# Patient Record
Sex: Female | Born: 1950 | Race: Black or African American | Hispanic: No | Smoking: Current some day smoker
Health system: Southern US, Community
[De-identification: ages and names within clinical notes are randomized; demographics above are authoritative.]

## PROBLEM LIST (undated history)

## (undated) DIAGNOSIS — D649 Anemia, unspecified: Secondary | ICD-10-CM

## (undated) DIAGNOSIS — M797 Fibromyalgia: Secondary | ICD-10-CM

## (undated) DIAGNOSIS — G8929 Other chronic pain: Secondary | ICD-10-CM

## (undated) DIAGNOSIS — E119 Type 2 diabetes mellitus without complications: Secondary | ICD-10-CM

## (undated) DIAGNOSIS — J45909 Unspecified asthma, uncomplicated: Secondary | ICD-10-CM

## (undated) DIAGNOSIS — M1990 Unspecified osteoarthritis, unspecified site: Secondary | ICD-10-CM

## (undated) DIAGNOSIS — K219 Gastro-esophageal reflux disease without esophagitis: Secondary | ICD-10-CM

## (undated) DIAGNOSIS — R079 Chest pain, unspecified: Secondary | ICD-10-CM

## (undated) DIAGNOSIS — M545 Low back pain, unspecified: Secondary | ICD-10-CM

## (undated) DIAGNOSIS — G4733 Obstructive sleep apnea (adult) (pediatric): Secondary | ICD-10-CM

## (undated) DIAGNOSIS — I10 Essential (primary) hypertension: Secondary | ICD-10-CM

## (undated) DIAGNOSIS — R0602 Shortness of breath: Secondary | ICD-10-CM

## (undated) DIAGNOSIS — G5603 Carpal tunnel syndrome, bilateral upper limbs: Secondary | ICD-10-CM

## (undated) DIAGNOSIS — G43909 Migraine, unspecified, not intractable, without status migrainosus: Secondary | ICD-10-CM

## (undated) HISTORY — PX: FOOT SURGERY: SHX648

## (undated) HISTORY — PX: CARPAL TUNNEL RELEASE: SHX101

## (undated) HISTORY — PX: TONSILLECTOMY: SUR1361

---

## 1974-09-20 HISTORY — PX: HEMORRHOID SURGERY: SHX153

## 1979-05-22 HISTORY — PX: BREAST SURGERY: SHX581

## 2007-09-21 HISTORY — PX: BREAST SURGERY: SHX581

## 2009-05-01 ENCOUNTER — Encounter: Admission: RE | Admit: 2009-05-01 | Discharge: 2009-05-01 | Payer: Self-pay | Admitting: Internal Medicine

## 2009-05-09 ENCOUNTER — Ambulatory Visit: Payer: Self-pay | Admitting: Vascular Surgery

## 2009-08-11 ENCOUNTER — Encounter: Admission: RE | Admit: 2009-08-11 | Discharge: 2009-08-11 | Payer: Self-pay | Admitting: Family Medicine

## 2010-10-11 ENCOUNTER — Encounter: Payer: Self-pay | Admitting: Family Medicine

## 2010-10-12 ENCOUNTER — Encounter: Payer: Self-pay | Admitting: Family Medicine

## 2010-10-21 ENCOUNTER — Encounter: Payer: Self-pay | Admitting: Internal Medicine

## 2010-11-03 ENCOUNTER — Other Ambulatory Visit: Payer: Self-pay | Admitting: Internal Medicine

## 2010-11-03 DIAGNOSIS — M4642 Discitis, unspecified, cervical region: Secondary | ICD-10-CM

## 2010-11-08 ENCOUNTER — Other Ambulatory Visit: Payer: Self-pay

## 2010-11-13 ENCOUNTER — Ambulatory Visit
Admission: RE | Admit: 2010-11-13 | Discharge: 2010-11-13 | Disposition: A | Discharge status: Home / Self Care | Payer: Medicare PPO | Source: Ambulatory Visit | Attending: Internal Medicine | Admitting: Internal Medicine

## 2010-11-13 DIAGNOSIS — M4642 Discitis, unspecified, cervical region: Secondary | ICD-10-CM

## 2010-11-30 ENCOUNTER — Other Ambulatory Visit: Payer: Self-pay | Admitting: Geriatric Medicine

## 2010-11-30 DIAGNOSIS — N950 Postmenopausal bleeding: Secondary | ICD-10-CM

## 2010-12-03 ENCOUNTER — Other Ambulatory Visit: Payer: Medicare PPO

## 2010-12-07 ENCOUNTER — Other Ambulatory Visit: Payer: Medicare PPO

## 2010-12-07 ENCOUNTER — Ambulatory Visit
Admission: RE | Admit: 2010-12-07 | Discharge: 2010-12-07 | Disposition: A | Discharge status: Home / Self Care | Payer: Medicare PPO | Source: Ambulatory Visit | Attending: Geriatric Medicine | Admitting: Geriatric Medicine

## 2010-12-07 ENCOUNTER — Inpatient Hospital Stay: Admission: RE | Admit: 2010-12-07 | Payer: Medicare PPO | Source: Ambulatory Visit

## 2010-12-07 DIAGNOSIS — N950 Postmenopausal bleeding: Secondary | ICD-10-CM

## 2011-02-02 NOTE — Procedures (Signed)
DUPLEX DEEP VENOUS EXAM - LOWER EXTREMITY   INDICATION:  Left ankle/foot edema.   HISTORY:  Edema:  Left lower extremity ankle/foot since May.  Trauma/Surgery:  Pain:  Left lower extremity since May.  PE:  No.  Previous DVT:  No.  Anticoagulants:  No.  Other:   DUPLEX EXAM:                CFV   SFV   PopV   PTV   GSV                R  L  R  L  R  L   R  L  R  L  Thrombosis    0  0     0     0      0     0  Spontaneous   +  +     +     +      +     +  Phasic        +  +     +     +      +     +  Augmentation  +  +     +     +      +     +  Compressible  +  +     +     +      +     +  Competent     +  D     +     +      +     +   Legend:  + - yes  o - no  p - partial  D - decreased   IMPRESSION:  No evidence of deep or superficial venous thrombosis in the  left lower extremity or right common femoral vein.    _____________________________  Quita Skye Hart [place], M.D.   AS/MEDQ  D:  05/09/2009  T:  05/10/2009  Job:  [id number]

## 2013-03-28 ENCOUNTER — Other Ambulatory Visit (HOSPITAL_COMMUNITY): Payer: Self-pay | Admitting: Internal Medicine

## 2013-03-28 DIAGNOSIS — M25562 Pain in left knee: Secondary | ICD-10-CM

## 2013-03-28 DIAGNOSIS — M7989 Other specified soft tissue disorders: Secondary | ICD-10-CM

## 2013-03-29 ENCOUNTER — Ambulatory Visit (HOSPITAL_COMMUNITY)
Admission: RE | Admit: 2013-03-29 | Discharge: 2013-03-29 | Disposition: A | Discharge status: Home / Self Care | Payer: Medicare PPO | Source: Ambulatory Visit | Attending: Internal Medicine | Admitting: Internal Medicine

## 2013-03-29 DIAGNOSIS — M7989 Other specified soft tissue disorders: Secondary | ICD-10-CM

## 2013-03-29 DIAGNOSIS — M25562 Pain in left knee: Secondary | ICD-10-CM

## 2013-03-29 NOTE — Progress Notes (Signed)
VASCULAR LAB PRELIMINARY  PRELIMINARY  PRELIMINARY  PRELIMINARY  Left lower extremity venous duplex completed.    Preliminary report:  Left:  No evidence of DVT, superficial thrombosis, or Baker's cyst.  Cris Gibby, RVT 03/29/2013, 11:52 AM

## 2013-04-18 ENCOUNTER — Encounter (HOSPITAL_COMMUNITY): Payer: Self-pay | Admitting: Emergency Medicine

## 2013-04-18 ENCOUNTER — Emergency Department (HOSPITAL_COMMUNITY): Payer: Medicare PPO

## 2013-04-18 ENCOUNTER — Inpatient Hospital Stay (HOSPITAL_COMMUNITY)
Admission: EM | Admit: 2013-04-18 | Discharge: 2013-04-20 | DRG: 916 | Disposition: A | Discharge status: Home / Self Care | Payer: Medicare PPO | Attending: Internal Medicine | Admitting: Internal Medicine

## 2013-04-18 DIAGNOSIS — I5032 Chronic diastolic (congestive) heart failure: Secondary | ICD-10-CM | POA: Diagnosis present

## 2013-04-18 DIAGNOSIS — E119 Type 2 diabetes mellitus without complications: Secondary | ICD-10-CM

## 2013-04-18 DIAGNOSIS — I5030 Unspecified diastolic (congestive) heart failure: Secondary | ICD-10-CM | POA: Diagnosis present

## 2013-04-18 DIAGNOSIS — E871 Hypo-osmolality and hyponatremia: Secondary | ICD-10-CM

## 2013-04-18 DIAGNOSIS — R079 Chest pain, unspecified: Secondary | ICD-10-CM

## 2013-04-18 DIAGNOSIS — I10 Essential (primary) hypertension: Secondary | ICD-10-CM | POA: Diagnosis present

## 2013-04-18 DIAGNOSIS — G8929 Other chronic pain: Secondary | ICD-10-CM | POA: Diagnosis present

## 2013-04-18 DIAGNOSIS — K3189 Other diseases of stomach and duodenum: Secondary | ICD-10-CM | POA: Diagnosis present

## 2013-04-18 DIAGNOSIS — F39 Unspecified mood [affective] disorder: Secondary | ICD-10-CM | POA: Diagnosis present

## 2013-04-18 DIAGNOSIS — D509 Iron deficiency anemia, unspecified: Secondary | ICD-10-CM

## 2013-04-18 DIAGNOSIS — G4730 Sleep apnea, unspecified: Secondary | ICD-10-CM | POA: Diagnosis present

## 2013-04-18 DIAGNOSIS — R109 Unspecified abdominal pain: Secondary | ICD-10-CM

## 2013-04-18 DIAGNOSIS — G629 Polyneuropathy, unspecified: Secondary | ICD-10-CM | POA: Diagnosis present

## 2013-04-18 DIAGNOSIS — M129 Arthropathy, unspecified: Secondary | ICD-10-CM | POA: Diagnosis present

## 2013-04-18 DIAGNOSIS — E876 Hypokalemia: Secondary | ICD-10-CM

## 2013-04-18 DIAGNOSIS — G589 Mononeuropathy, unspecified: Secondary | ICD-10-CM | POA: Diagnosis present

## 2013-04-18 DIAGNOSIS — IMO0001 Reserved for inherently not codable concepts without codable children: Secondary | ICD-10-CM | POA: Diagnosis present

## 2013-04-18 DIAGNOSIS — K5900 Constipation, unspecified: Secondary | ICD-10-CM

## 2013-04-18 DIAGNOSIS — T783XXA Angioneurotic edema, initial encounter: Principal | ICD-10-CM

## 2013-04-18 DIAGNOSIS — J45909 Unspecified asthma, uncomplicated: Secondary | ICD-10-CM | POA: Diagnosis present

## 2013-04-18 DIAGNOSIS — T46905A Adverse effect of unspecified agents primarily affecting the cardiovascular system, initial encounter: Secondary | ICD-10-CM | POA: Diagnosis present

## 2013-04-18 DIAGNOSIS — Z794 Long term (current) use of insulin: Secondary | ICD-10-CM

## 2013-04-18 DIAGNOSIS — T783XXD Angioneurotic edema, subsequent encounter: Secondary | ICD-10-CM

## 2013-04-18 HISTORY — DX: Unspecified osteoarthritis, unspecified site: M19.90

## 2013-04-18 HISTORY — DX: Low back pain, unspecified: M54.50

## 2013-04-18 HISTORY — DX: Low back pain: M54.5

## 2013-04-18 HISTORY — DX: Obstructive sleep apnea (adult) (pediatric): G47.33

## 2013-04-18 HISTORY — DX: Unspecified asthma, uncomplicated: J45.909

## 2013-04-18 HISTORY — DX: Chest pain, unspecified: R07.9

## 2013-04-18 HISTORY — DX: Essential (primary) hypertension: I10

## 2013-04-18 HISTORY — DX: Carpal tunnel syndrome, bilateral upper limbs: G56.03

## 2013-04-18 HISTORY — DX: Migraine, unspecified, not intractable, without status migrainosus: G43.909

## 2013-04-18 HISTORY — DX: Shortness of breath: R06.02

## 2013-04-18 HISTORY — DX: Other chronic pain: G89.29

## 2013-04-18 HISTORY — DX: Anemia, unspecified: D64.9

## 2013-04-18 HISTORY — DX: Gastro-esophageal reflux disease without esophagitis: K21.9

## 2013-04-18 HISTORY — DX: Fibromyalgia: M79.7

## 2013-04-18 HISTORY — DX: Type 2 diabetes mellitus without complications: E11.9

## 2013-04-18 LAB — COMPREHENSIVE METABOLIC PANEL
ALT: 11 U/L (ref 0–35)
AST: 25 U/L (ref 0–37)
Albumin: 3.9 g/dL (ref 3.5–5.2)
Alkaline Phosphatase: 109 U/L (ref 39–117)
BUN: 9 mg/dL (ref 6–23)
CO2: 23 mEq/L (ref 19–32)
Calcium: 9.3 mg/dL (ref 8.4–10.5)
Chloride: 84 mEq/L — ABNORMAL LOW (ref 96–112)
Creatinine, Ser: 0.97 mg/dL (ref 0.50–1.10)
GFR calc Af Amer: 72 mL/min — ABNORMAL LOW (ref >90)
GFR calc non Af Amer: 62 mL/min — ABNORMAL LOW (ref >90)
Glucose, Bld: 124 mg/dL — ABNORMAL HIGH (ref 70–99)
Potassium: 4.9 mEq/L (ref 3.5–5.1)
Sodium: 121 mEq/L — ABNORMAL LOW (ref 135–145)
Total Bilirubin: 0.3 mg/dL (ref 0.3–1.2)
Total Protein: 7.3 g/dL (ref 6.0–8.3)

## 2013-04-18 LAB — CBC WITH DIFFERENTIAL/PLATELET
Basophils Absolute: 0.1 10*3/uL (ref 0.0–0.1)
Basophils Relative: 1 % (ref 0–1)
Eosinophils Absolute: 0.2 10*3/uL (ref 0.0–0.7)
Eosinophils Relative: 3 % (ref 0–5)
HCT: 27.5 % — ABNORMAL LOW (ref 36.0–46.0)
Hemoglobin: 8.9 g/dL — ABNORMAL LOW (ref 12.0–15.0)
Lymphocytes Relative: 21 % (ref 12–46)
Lymphs Abs: 1.9 10*3/uL (ref 0.7–4.0)
MCH: 23.6 pg — ABNORMAL LOW (ref 26.0–34.0)
MCHC: 32.4 g/dL (ref 30.0–36.0)
MCV: 72.9 fL — ABNORMAL LOW (ref 78.0–100.0)
Monocytes Absolute: 1 10*3/uL (ref 0.1–1.0)
Monocytes Relative: 11 % (ref 3–12)
Neutro Abs: 5.8 10*3/uL (ref 1.7–7.7)
Neutrophils Relative %: 65 % (ref 43–77)
Platelets: 376 10*3/uL (ref 150–400)
RBC: 3.77 MIL/uL — ABNORMAL LOW (ref 3.87–5.11)
RDW: 18.1 % — ABNORMAL HIGH (ref 11.5–15.5)
WBC: 8.9 10*3/uL (ref 4.0–10.5)

## 2013-04-18 LAB — BASIC METABOLIC PANEL
CO2: 21 mEq/L (ref 19–32)
GFR calc non Af Amer: 67 mL/min — ABNORMAL LOW (ref >90)
Glucose, Bld: 235 mg/dL — ABNORMAL HIGH (ref 70–99)
Potassium: 4.8 mEq/L (ref 3.5–5.1)
Sodium: 119 mEq/L — CL (ref 135–145)

## 2013-04-18 LAB — CBC
MCH: 24 pg — ABNORMAL LOW (ref 26.0–34.0)
Platelets: 379 10*3/uL (ref 150–400)
RBC: 3.84 MIL/uL — ABNORMAL LOW (ref 3.87–5.11)
WBC: 9.6 10*3/uL (ref 4.0–10.5)

## 2013-04-18 LAB — URINALYSIS, ROUTINE W REFLEX MICROSCOPIC
Bilirubin Urine: NEGATIVE
Glucose, UA: NEGATIVE mg/dL
Hgb urine dipstick: NEGATIVE
Ketones, ur: NEGATIVE mg/dL
Leukocytes, UA: NEGATIVE
Nitrite: NEGATIVE
Protein, ur: NEGATIVE mg/dL
Specific Gravity, Urine: 1.011 (ref 1.005–1.030)
Urobilinogen, UA: 0.2 mg/dL (ref 0.0–1.0)
pH: 5.5 (ref 5.0–8.0)

## 2013-04-18 LAB — GLUCOSE, CAPILLARY: Glucose-Capillary: 282 mg/dL — ABNORMAL HIGH (ref 70–99)

## 2013-04-18 LAB — CREATININE, SERUM
Creatinine, Ser: 0.94 mg/dL (ref 0.50–1.10)
GFR calc Af Amer: 75 mL/min — ABNORMAL LOW (ref >90)

## 2013-04-18 LAB — TROPONIN I: Troponin I: <0.3 ng/mL (ref <0.30)

## 2013-04-18 LAB — D-DIMER, QUANTITATIVE (NOT AT ARMC): D-Dimer, Quant: 7.35 ug/mL-FEU — ABNORMAL HIGH (ref 0.00–0.48)

## 2013-04-18 LAB — URIC ACID: Uric Acid, Serum: 6.8 mg/dL (ref 2.4–7.0)

## 2013-04-18 MED ORDER — IOHEXOL 300 MG/ML  SOLN
100.0000 mL | Freq: Once | INTRAMUSCULAR | Status: AC | PRN
  Administered 2013-04-18: 100 mL via INTRAVENOUS

## 2013-04-18 MED ORDER — FAMOTIDINE IN NACL 20-0.9 MG/50ML-% IV SOLN
20.0000 mg | Freq: Once | INTRAVENOUS | Status: AC
  Administered 2013-04-18: 20 mg via INTRAVENOUS
  Filled 2013-04-18: qty 50

## 2013-04-18 MED ORDER — ACETAMINOPHEN 325 MG PO TABS: 650.0000 mg | ORAL_TABLET | Freq: Four times a day (QID) | ORAL | Status: DC | PRN

## 2013-04-18 MED ORDER — BUTALBITAL-APAP-CAFFEINE 50-325-40 MG PO TABS: 1.0000 | ORAL_TABLET | ORAL | Status: DC | PRN

## 2013-04-18 MED ORDER — OXYCODONE HCL 10 MG PO TB12
10.0000 mg | ORAL_TABLET | Freq: Four times a day (QID) | ORAL | Status: DC | PRN
  Filled 2013-04-18: qty 1

## 2013-04-18 MED ORDER — METHYLPREDNISOLONE SODIUM SUCC 125 MG IJ SOLR
125.0000 mg | INTRAMUSCULAR | Status: AC
  Administered 2013-04-18: 125 mg via INTRAVENOUS
  Filled 2013-04-18: qty 2

## 2013-04-18 MED ORDER — FUROSEMIDE 40 MG PO TABS
40.0000 mg | ORAL_TABLET | Freq: Two times a day (BID) | ORAL | Status: DC
Start: 2013-04-19 — End: 2013-04-18
  Filled 2013-04-18: qty 1

## 2013-04-18 MED ORDER — BISACODYL 10 MG RE SUPP: 10.0000 mg | Freq: Every day | RECTAL | Status: DC | PRN

## 2013-04-18 MED ORDER — ENOXAPARIN SODIUM 40 MG/0.4ML ~~LOC~~ SOLN
40.0000 mg | SUBCUTANEOUS | Status: DC
  Administered 2013-04-18 – 2013-04-19 (×2): 40 mg via SUBCUTANEOUS
  Filled 2013-04-18 (×3): qty 0.4

## 2013-04-18 MED ORDER — MORPHINE SULFATE 2 MG/ML IJ SOLN: 1.0000 mg | INTRAMUSCULAR | Status: DC | PRN

## 2013-04-18 MED ORDER — SODIUM CHLORIDE 0.9 % IV SOLN
INTRAVENOUS | Status: DC
  Administered 2013-04-18: via INTRAVENOUS

## 2013-04-18 MED ORDER — ESTRADIOL 2 MG PO TABS
2.0000 mg | ORAL_TABLET | Freq: Every day | ORAL | Status: DC
  Administered 2013-04-18: 2 mg via ORAL
  Filled 2013-04-18: qty 1

## 2013-04-18 MED ORDER — DIPHENHYDRAMINE HCL 50 MG/ML IJ SOLN
25.0000 mg | Freq: Once | INTRAMUSCULAR | Status: AC
  Administered 2013-04-18: 25 mg via INTRAVENOUS
  Filled 2013-04-18: qty 1

## 2013-04-18 MED ORDER — ACETAMINOPHEN 650 MG RE SUPP: 650.0000 mg | Freq: Four times a day (QID) | RECTAL | Status: DC | PRN

## 2013-04-18 MED ORDER — IOHEXOL 300 MG/ML  SOLN
25.0000 mL | INTRAMUSCULAR | Status: AC
  Administered 2013-04-18: 25 mL via ORAL

## 2013-04-18 MED ORDER — METHYLPREDNISOLONE SODIUM SUCC 40 MG IJ SOLR
40.0000 mg | Freq: Two times a day (BID) | INTRAMUSCULAR | Status: DC
  Administered 2013-04-18 – 2013-04-19 (×2): 40 mg via INTRAVENOUS
  Filled 2013-04-18 (×3): qty 1

## 2013-04-18 MED ORDER — INSULIN ASPART 100 UNIT/ML ~~LOC~~ SOLN
0.0000 [IU] | Freq: Three times a day (TID) | SUBCUTANEOUS | Status: DC
  Administered 2013-04-19: 13:00:00 5 [IU] via SUBCUTANEOUS
  Administered 2013-04-19 (×2): 7 [IU] via SUBCUTANEOUS
  Administered 2013-04-20: 2 [IU] via SUBCUTANEOUS

## 2013-04-18 MED ORDER — MAGNESIUM HYDROXIDE 400 MG/5ML PO SUSP: 30.0000 mL | Freq: Every day | ORAL | Status: DC | PRN

## 2013-04-18 MED ORDER — MORPHINE SULFATE 4 MG/ML IJ SOLN
4.0000 mg | Freq: Once | INTRAMUSCULAR | Status: AC
  Administered 2013-04-18: 4 mg via INTRAVENOUS
  Filled 2013-04-18: qty 1

## 2013-04-18 MED ORDER — CARISOPRODOL 350 MG PO TABS
350.0000 mg | ORAL_TABLET | Freq: Two times a day (BID) | ORAL | Status: DC
Start: 2013-04-18 — End: 2013-04-18
  Filled 2013-04-18: qty 1

## 2013-04-18 MED ORDER — DIPHENHYDRAMINE HCL 50 MG/ML IJ SOLN
25.0000 mg | Freq: Four times a day (QID) | INTRAMUSCULAR | Status: DC | PRN
Start: 2013-04-18 — End: 2013-04-20
  Administered 2013-04-19 (×3): 25 mg via INTRAVENOUS
  Filled 2013-04-18 (×3): qty 1

## 2013-04-18 MED ORDER — INSULIN ASPART 100 UNIT/ML ~~LOC~~ SOLN
0.0000 [IU] | Freq: Every day | SUBCUTANEOUS | Status: DC
  Administered 2013-04-18: 3 [IU] via SUBCUTANEOUS
  Administered 2013-04-19: 2 [IU] via SUBCUTANEOUS

## 2013-04-18 MED ORDER — INSULIN GLARGINE 100 UNIT/ML ~~LOC~~ SOLN
15.0000 [IU] | Freq: Every day | SUBCUTANEOUS | Status: DC
  Administered 2013-04-18 – 2013-04-19 (×2): 15 [IU] via SUBCUTANEOUS
  Filled 2013-04-18 (×3): qty 0.15

## 2013-04-18 MED ORDER — FAMOTIDINE IN NACL 20-0.9 MG/50ML-% IV SOLN
20.0000 mg | Freq: Two times a day (BID) | INTRAVENOUS | Status: DC
  Administered 2013-04-19 – 2013-04-20 (×3): 20 mg via INTRAVENOUS
  Filled 2013-04-18 (×5): qty 50

## 2013-04-18 MED ORDER — FENTANYL CITRATE 0.05 MG/ML IJ SOLN
100.0000 ug | Freq: Once | INTRAMUSCULAR | Status: AC
  Administered 2013-04-18: 100 ug via INTRAVENOUS
  Filled 2013-04-18: qty 2

## 2013-04-18 MED ORDER — SODIUM CHLORIDE 0.9 % IJ SOLN
3.0000 mL | Freq: Two times a day (BID) | INTRAMUSCULAR | Status: DC
  Administered 2013-04-19 – 2013-04-20 (×3): 3 mL via INTRAVENOUS

## 2013-04-18 MED ORDER — HYDROCODONE-ACETAMINOPHEN 5-325 MG PO TABS
1.0000 | ORAL_TABLET | ORAL | Status: DC | PRN
  Administered 2013-04-18 – 2013-04-20 (×5): 2 via ORAL
  Filled 2013-04-18 (×6): qty 2

## 2013-04-18 NOTE — ED Notes (Signed)
Pt states CP started last night and woke her from sleep.  She thought is was gas pain. This morning around 1030 when she woke up she noticed facial swelling on the R side of her face, down neck and upper arm.  States the pain is across the top of her chest, down her sternum, under each breast, and in her back.

## 2013-04-18 NOTE — Progress Notes (Signed)
Was called about patient who is a 61 year old female history of hypertension, hyperlipidemia, diabetes ON presented with midsternal chest pain and upper abdominal pain chest it has been pressure in nature and not care for several days. Patient also noted to have right facial swelling right upper lip swelling however per ED PA, patient is speaking in full sentences he is satting greater than 90% and is in no acute arrest artery distress. Patient also noted to have a sodium of 121. Patient also noted to be on ACE inhibitor. Patient has been accepted to a step down bed. Patient to be seen by the hospitalist in the emergency room prior to transfer to a step down bed. Patient will need to be admitted for chest pain rule out. D-dimer is pending as patient is on estradiol as well. First set of troponin i-STAT negative. EKG with no acute abnormalities the ED PA.

## 2013-04-18 NOTE — ED Provider Notes (Signed)
Medical screening examination/treatment/procedure(s) were performed by non-physician practitioner and as supervising physician I was immediately available for consultation/collaboration.  Flint Melter, MD 04/18/13 [phone]

## 2013-04-18 NOTE — ED Provider Notes (Signed)
CSN: [id number]     Arrival date & time 04/18/13  1249 History     First MD Initiated Contact with Patient 04/18/13 1327     Chief Complaint  Patient presents with  . Chest Pain  . Shortness of Breath  . Facial Swelling   (Consider location/radiation/quality/duration/timing/severity/associated sxs/prior Treatment) HPI Patient presents emergency department with upper lip swelling.  Chest pain, and abdominal pain.  Patient was sent here by her primary care Dr. patient, states the facial swelling, started this morning.  She states that off, and on chest para last several days.  Patient, states she's been treated by her Dr. for lower extremity edema, with Lasix.  Patient denies nausea, vomiting, diarrhea, headache, blurred vision, weakness, dizziness, numbness, syncope, fever, cough, rash, back pain, neck pain, or dysuria.  Patient, states, that is not having any shortness of breath, or tongue swelling.  Patient sent here by her primary Dr. with concerns of her swelling and chest.  Patient says that nothing seems to make her condition, better or worse. Past Medical History  Diagnosis Date  . Fibromyalgia   . Diabetes mellitus without complication   . Arthritis   . Carpal tunnel syndrome on both sides   . Sleep apnea   . Hypertension   . Asthma    Past Surgical History  Procedure Laterality Date  . Wrist surgery      bilateral  . Tonsillectomy    . Breast surgery    . Cesarean section     No family history on file. History  Substance Use Topics  . Smoking status: Not on file  . Smokeless tobacco: Not on file  . Alcohol Use: Not on file   OB History   Grav Para Term Preterm Abortions TAB SAB Ect Mult Living                 Review of Systems All other systems negative except as documented in the HPI. All pertinent positives and negatives as reviewed in the HPI. Allergies  Asa and Motrin  Home Medications   Current Outpatient Rx  Name  Route  Sig  Dispense  Refill  .  Aspirin-Acetaminophen-Caffeine (EXCEDRIN EXTRA STRENGTH PO)   Oral   Take 2 tablets by mouth as needed (for pain).         . carisoprodol (SOMA) 350 MG tablet   Oral   Take 350 mg by mouth 2 (two) times daily.         Marland Kitchen estradiol (ESTRACE) 2 MG tablet   Oral   Take 2 mg by mouth daily.         . furosemide (LASIX) 40 MG tablet   Oral   Take 40 mg by mouth 2 (two) times daily.         . insulin glargine (LANTUS) 100 UNIT/ML injection   Subcutaneous   Inject 15 Units into the skin at bedtime.         Marland Kitchen lisinopril (PRINIVIL,ZESTRIL) 5 MG tablet   Oral   Take 5 mg by mouth daily.         . metFORMIN (GLUCOPHAGE) 1000 MG tablet   Oral   Take 1,000 mg by mouth 2 (two) times daily with a meal.         . Milnacipran (SAVELLA) 50 MG TABS   Oral   Take 50 mg by mouth 2 (two) times daily.         Marland Kitchen OVER THE COUNTER MEDICATION   Topical  Apply 1 application topically as needed (for pain).         Marland Kitchen oxyCODONE (OXYCONTIN) 10 MG 12 hr tablet   Oral   Take 10 mg by mouth every 6 (six) hours as needed for pain.         . potassium chloride SA (K-DUR,KLOR-CON) 20 MEQ tablet   Oral   Take 20 mEq by mouth 2 (two) times daily.         . pregabalin (LYRICA) 300 MG capsule   Oral   Take 300 mg by mouth 2 (two) times daily.          BP 126/81  Pulse 95  Temp(Src) 98.7 F (37.1 C) (Oral)  Resp 15  SpO2 100%  LMP 11/13/2010 Physical Exam  Nursing note and vitals reviewed. Constitutional: She is oriented to person, place, and time. She appears well-developed and well-nourished. No distress.  HENT:  Head: Normocephalic.  Mouth/Throat: Oropharynx is clear and moist.  [place] of swelling of her upper lip and right face  Eyes: Pupils are equal, round, and reactive to light.  Neck: Normal range of motion. Neck supple.  Cardiovascular: Normal rate, regular rhythm and normal heart sounds.  Exam reveals no gallop and no friction rub.   No murmur  heard. Pulmonary/Chest: Effort normal and breath sounds normal. No respiratory distress. She has no wheezes.  Abdominal: Soft. Bowel sounds are normal. She exhibits no distension. There is tenderness. There is no rebound and no guarding.  Neurological: She is alert and oriented to person, place, and time. She exhibits normal muscle tone. Coordination normal.  Skin: Skin is warm and dry. No rash noted.    ED Course   Procedures (including critical care time)  Labs Reviewed  COMPREHENSIVE METABOLIC PANEL - Abnormal; Notable for the following:    Sodium 121 (*)    Chloride 84 (*)    Glucose, Bld 124 (*)    GFR calc non Af Amer 62 (*)    GFR calc Af Amer 72 (*)    All other components within normal limits  CBC WITH DIFFERENTIAL - Abnormal; Notable for the following:    RBC 3.77 (*)    Hemoglobin 8.9 (*)    HCT 27.5 (*)    MCV 72.9 (*)    MCH 23.6 (*)    RDW 18.1 (*)    All other components within normal limits  URINALYSIS, ROUTINE W REFLEX MICROSCOPIC  D-DIMER, QUANTITATIVE  POCT I-STAT TROPONIN I  POCT I-STAT TROPONIN I   Dg Chest 2 View  04/18/2013   *RADIOLOGY REPORT*  Clinical Data: Pain, difficulty breathing  CHEST - 2 VIEW  Comparison:  None  Findings: The cardiomediastinal silhouette is unremarkable.  No acute infiltrate or pleural effusion.  Linear atelectasis or scarring noted lingula.  IMPRESSION: No active disease.  Linear atelectasis or scarring in the lingula.   Original Report Authenticated By: Natasha Mead, M.D.   Ct Abdomen Pelvis W Contrast  04/18/2013   *RADIOLOGY REPORT*  Clinical Data: Abdominal distention, nausea and diarrhea.  CT ABDOMEN AND PELVIS WITH CONTRAST  Technique:  Multidetector CT imaging of the abdomen and pelvis was performed following the standard protocol during bolus administration of intravenous contrast.  Contrast: OMNIPAQUE IOHEXOL 300 MG/ML  SOLN  Comparison: None.  Findings: The liver, gallbladder, pancreas, spleen, adrenal glands and  kidneys are unremarkable.  Bowel shows a large amount of fecal material throughout the proximal two thirds of the colon without evidence of obvious lesion or  bowel obstruction.  Small bowel is unremarkable.  No acute inflammatory process or abnormal fluid collection is identified.  No masses or enlarged lymph nodes are seen.  No evidence of hernia.  The bladder is unremarkable.  Adnexal regions show some cystic changes especially on the left, where a dominant superior adnexal cyst measures approximately 4 cm in greatest diameter.  There is no associated free fluid in the pelvis.  No bony abnormalities are identified.  IMPRESSION: No acute findings.  Moderate fecal material throughout the proximal two thirds of the colon.  Left adnexal cyst measuring approximately 4 cm.   Original Report Authenticated By: Irish Lack, M.D.    Patient be admitted to the hospital for further evaluation of her chest pain, and angioedema.  Patient is also found to be hyponatremic. MDM  MDM Reviewed: vitals, nursing note and previous chart Interpretation: labs, ECG, x-ray and CT scan Consults: admitting MD   Date: 04/18/2013  Rate: 91  Rhythm: normal sinus rhythm  QRS Axis: normal  Intervals: normal  ST/T Wave abnormalities: normal  Conduction Disutrbances:none  Narrative Interpretation:   Old EKG Reviewed: unchanged      Carlyle Dolly, PA-C 04/18/13 1941

## 2013-04-18 NOTE — H&P (Addendum)
[facility] History and Physical  KAJAL SCALICI  ZOX:[id number]  DOB: 09-03-1951  DOA: 04/18/2013  Referring physician: Dr. Effie Shy PCP: No primary provider on file.   Chief Complaint: Chest pain  HPI: Shari Brandt is a 62 y.o. female with Past medical history of diabetes mellitus, hypertension, sleep apnea, diastolic heart failure, chronic pain, on chronic estrogen, morbid obesity.  Patient presented to the ER with the complaint of chest pain that started 3 days ago. The pain felt like heaviness and pressure along with indigestion and has been ongoing on and off. The patient is currently chest pain-free. And progressively getting worse today. This morning when she woke up she saw that her right side of the face has swollen with the swelling of the lip. The swelling was progressively worsening and then she came to the ER after receiving Benadryl the swelling resolved but then she had similar swelling of the left side of the face. She denies any trouble breathing or choking on her food. She denies any similar swelling in the past. She has been on lisinopril since last 2 months and her current prescription has been feeling since last 1 month. She has been on Lasix on and off. And she feels that she has generalized swelling after she started taking Lyrica 2 years ago. Other than that the patient denies any new medication or new exposure to any other substance. The patient also had nausea sense of fullness of her stomach diarrhea and distention of her abdomen which is progressively worsening since last 3 days. The fullness of the stomach she has been feeling since last few months. She has diabetes since last 5 years and mention her HbA1c been well controlled. She has been on estrogen since last many years in mentions that without it she has hot flashes. She had bilateral leg swelling, with more severe swelling in her left leg and had a duplex which was negative for the left leg for any DVT  in the beginning of this month.  Review of Systems: as mentioned in the history of present illness.  A Comprehensive review of the other systems is negative.  Past Medical History  Diagnosis Date  . Fibromyalgia   . Diabetes mellitus without complication   . Arthritis   . Carpal tunnel syndrome on both sides   . Sleep apnea   . Hypertension   . Asthma    Past Surgical History  Procedure Laterality Date  . Wrist surgery      bilateral  . Tonsillectomy    . Breast surgery    . Cesarean section     Social History:  has no tobacco, alcohol, and drug history on file. Patient is coming from home. Patient can participate in ADLs.  Allergies  Allergen Reactions  . Asa (Aspirin) Swelling  . Motrin (Ibuprofen) Swelling    Stomach swell    History reviewed. No pertinent family history.  Prior to Admission medications   Medication Sig Start Date End Date Taking? Authorizing Provider  Aspirin-Acetaminophen-Caffeine (EXCEDRIN EXTRA STRENGTH PO) Take 2 tablets by mouth as needed (for pain).   Yes Historical Provider, MD  carisoprodol (SOMA) 350 MG tablet Take 350 mg by mouth 2 (two) times daily.   Yes Historical Provider, MD  estradiol (ESTRACE) 2 MG tablet Take 2 mg by mouth daily.   Yes Historical Provider, MD  furosemide (LASIX) 40 MG tablet Take 40 mg by mouth 2 (two) times daily.   Yes Historical Provider, MD  insulin glargine (  LANTUS) 100 UNIT/ML injection Inject 15 Units into the skin at bedtime.   Yes Historical Provider, MD  lisinopril (PRINIVIL,ZESTRIL) 5 MG tablet Take 5 mg by mouth daily.   Yes Historical Provider, MD  metFORMIN (GLUCOPHAGE) 1000 MG tablet Take 1,000 mg by mouth 2 (two) times daily with a meal.   Yes Historical Provider, MD  Milnacipran (SAVELLA) 50 MG TABS Take 50 mg by mouth 2 (two) times daily.   Yes Historical Provider, MD  OVER THE COUNTER MEDICATION Apply 1 application topically as needed (for pain).   Yes Historical Provider, MD  oxyCODONE  (OXYCONTIN) 10 MG 12 hr tablet Take 10 mg by mouth every 6 (six) hours as needed for pain.   Yes Historical Provider, MD  potassium chloride SA (K-DUR,KLOR-CON) 20 MEQ tablet Take 20 mEq by mouth 2 (two) times daily.   Yes Historical Provider, MD  pregabalin (LYRICA) 300 MG capsule Take 300 mg by mouth 2 (two) times daily.   Yes Historical Provider, MD    Physical Exam: Filed Vitals:   04/18/13 1500 04/18/13 1600 04/18/13 1631 04/18/13 1700  BP: 147/70 141/77 141/77 126/81  Pulse: 91 94  95  Temp:      TempSrc:      Resp: 19 15 18 15   SpO2: 100% 100% 100% 100%    General: Alert, Awake and Oriented to Time, Place and Person. Appear in mild distress Eyes: PERRL ENT: Oral Mucosa clear moist. No pharyngeal edema redness or exudate. Swelling of the left upper lip. Patient subjectively feels swelling bilaterally. Neck: No JVD, no Carotid Bruits, no Stiffness Cardiovascular: S1 and S2 Present, Murmur present, Peripheral Pulses Present Respiratory: Clear to Auscultation, Bilateral Air entry equal and Decreased, Abdomen: Bowel Sound Present, Soft and Non tender, no Organomegaly diffuse distention Skin: no decubitus Ulcer, Extremities: Pedal edema bilaterally, calf tenderness mild tenderness bilaterally. Neurologic: Mental status, Motor strength, Sensation, reflexes, Proprioception Grossly Unremarkable.  Labs on Admission:  Basic Metabolic Panel:  Recent Labs Lab 04/18/13 1430  NA 121*  K 4.9  CL 84*  CO2 23  GLUCOSE 124*  BUN 9  CREATININE 0.97  CALCIUM 9.3   Liver Function Tests:  Recent Labs Lab 04/18/13 1430  AST 25  ALT 11  ALKPHOS 109  BILITOT 0.3  PROT 7.3  ALBUMIN 3.9   No results found for this basename: LIPASE, AMYLASE,  in the last 168 hours No results found for this basename: AMMONIA,  in the last 168 hours CBC:  Recent Labs Lab 04/18/13 1430  WBC 8.9  NEUTROABS 5.8  HGB 8.9*  HCT 27.5*  MCV 72.9*  PLT 376   Cardiac Enzymes: No results found for  this basename: CKTOTAL, CKMB, CKMBINDEX, TROPONINI,  in the last 168 hours  BNP (last 3 results) No results found for this basename: PROBNP,  in the last 8760 hours CBG: No results found for this basename: GLUCAP,  in the last 168 hours  Radiological Exams on Admission: Dg Chest 2 View  04/18/2013   *RADIOLOGY REPORT*  Clinical Data: Pain, difficulty breathing  CHEST - 2 VIEW  Comparison:  None  Findings: The cardiomediastinal silhouette is unremarkable.  No acute infiltrate or pleural effusion.  Linear atelectasis or scarring noted lingula.  IMPRESSION: No active disease.  Linear atelectasis or scarring in the lingula.   Original Report Authenticated By: Natasha Mead, M.D.   Ct Abdomen Pelvis W Contrast  04/18/2013   *RADIOLOGY REPORT*  Clinical Data: Abdominal distention, nausea and diarrhea.  CT ABDOMEN  AND PELVIS WITH CONTRAST  Technique:  Multidetector CT imaging of the abdomen and pelvis was performed following the standard protocol during bolus administration of intravenous contrast.  Contrast: OMNIPAQUE IOHEXOL 300 MG/ML  SOLN  Comparison: None.  Findings: The liver, gallbladder, pancreas, spleen, adrenal glands and kidneys are unremarkable.  Bowel shows a large amount of fecal material throughout the proximal two thirds of the colon without evidence of obvious lesion or bowel obstruction.  Small bowel is unremarkable.  No acute inflammatory process or abnormal fluid collection is identified.  No masses or enlarged lymph nodes are seen.  No evidence of hernia.  The bladder is unremarkable.  Adnexal regions show some cystic changes especially on the left, where a dominant superior adnexal cyst measures approximately 4 cm in greatest diameter.  There is no associated free fluid in the pelvis.  No bony abnormalities are identified.  IMPRESSION: No acute findings.  Moderate fecal material throughout the proximal two thirds of the colon.  Left adnexal cyst measuring approximately 4 cm.   Original  Report Authenticated By: Irish Lack, M.D.    EKG: Independently reviewed. No ST-T wave changes suggestive of acute ischemia  Assessment/Plan Principal Problem:   Chest pain Active Problems:   Microcytic anemia   Constipation   Abdominal pain   Hyponatremia   Diabetes mellitus   Neuropathy   Mood disorder   Angioedema of lips   1. Chest pain The patient does have significant comorbidity factors. She had a chest pain which was pressure-like progressively getting worse. EKG and troponins are negative. We will admit her in the hospital follow serial troponins, telemetry and telemetry. We will get echocardiogram as well. After the patient underwent CT abdomen and pelvis a d-dimer was obtained which appears elevated. With patient's history of bilateral leg swelling, M.D. with a d-dimer, being on estrogen and chest pain her Well's score is high, therefore we will be getting a CT chest to rule out any pulmonary embolism.  2.  angioedema of the face  At present getting better with Benadryl and steroids . We will continue Benadryl, Pepcid, steroids. She does not appear to have compromised airway. They will monitor her in the step down unit. Avoid lisinopril.  3. hyponatremia  It is unclear whether the hyponatremia is acute or chronic or subacute.  Patient is new to our system. Pt denies any alcholol binge. She mentions since last 4 days she has not had any ETOH and before that she drink 2 beers a day only. A uric acid normal BUN is low normal serum creatinine is normal she does not appear to have any significant acid base disturbance, she has a low serum osmolarity of 261, normal TSH, urine sodium less than 10. This goes against SIADH. She could potentially have a hypervolemia. At present the urine osmolarity is pending. We will monitor her BMP every 4 hours since the patient is currently asymptomatic. Neuro checks every 4 hours. Fluid restrictions of 1 L. Increase of salt intake in  the diet. Holding Lasix. Monitoring in the step down unit. Further management will be decided based on urine osm Holding soma in view of possible hyponatremia effect.  4.Diabetes mellitus Holding metformin due to contrast. Holding lisinopril as well. On sliding scale with insulin Lantus at home dose.  5. Constipation More likely the cause of abdominal fullness and distention. CT abdomen and pelvis as not showing any acute abnormality. Patient will be placed on bowel regimen. Potentially gastroparesis due to her underlying diabetes,  may benefit from outpatient workup.  DVT Prophylaxis: Lovenox 40 mg Q24 hrs Nutrition: Fluid restricted diet  Code Status: Full  Family Communication: Family was at bedside and has been explained about the plan  Author: Lynden Oxford, MD [facility] Pager: [phone] 04/18/2013 8:29 PM    If 7PM-7AM, please contact night-coverage [email or web address] Password [email or web address]

## 2013-04-18 NOTE — Progress Notes (Signed)
CRITICAL VALUE ALERT  Critical value received: Sodium=119  Date of notification:  04/18/2013  Time of notification:  21:30  Critical value read back:yes  Nurse who received alert:  Sander Radon RN  MD notified (1st page):  Donnamarie Poag  Time of first page:  21:30  MD notified (2nd page):  Time of second page:  Responding MD:  K.Kirby  Time MD responded:  21:40

## 2013-04-18 NOTE — ED Provider Notes (Signed)
Shari Brandt is a 62 y.o. female Presents for evaluation of abdominal pain, abdominal swelling, chest discomfort, early satiety, and extremity swelling. She seen her doctor and was placed on Lasix for edema. She has not had her abdominal discomfort addressed.  Exam alert, obese female in moderate discomfort. Heart regular rate and rhythm. No murmur. Lungs clear to auscultation. Abdomen is distended. It is soft. She is tender in the epigastrium. Face has mild, indistinct angioedema, right cheek, and minimal angioedema of the right lids, upper and lower. No oral angioedema.  Assessment: Patient will need assessment with abdominal CT for evaluation of the discomfort and swelling. Her angioedema is likely related to lisinopril. She has hyponatremia, likely related to diuresis.  Plan: She'll likely need to be admitted for stabilization   Medical screening examination/treatment/procedure(s) were conducted as a shared visit with non-physician practitioner(s) and myself.  I personally evaluated the patient during the encounter  Flint Melter, MD 04/20/13 1002

## 2013-04-18 NOTE — ED Notes (Signed)
Admitting MD at bedside.

## 2013-04-19 ENCOUNTER — Encounter (HOSPITAL_COMMUNITY): Payer: Self-pay | Admitting: Radiology

## 2013-04-19 ENCOUNTER — Inpatient Hospital Stay (HOSPITAL_COMMUNITY): Payer: Medicare PPO

## 2013-04-19 DIAGNOSIS — R609 Edema, unspecified: Secondary | ICD-10-CM

## 2013-04-19 DIAGNOSIS — D509 Iron deficiency anemia, unspecified: Secondary | ICD-10-CM

## 2013-04-19 DIAGNOSIS — E871 Hypo-osmolality and hyponatremia: Secondary | ICD-10-CM

## 2013-04-19 DIAGNOSIS — R880 Cloudy (hemodialysis) (peritoneal) dialysis effluent: Secondary | ICD-10-CM

## 2013-04-19 DIAGNOSIS — Z5189 Encounter for other specified aftercare: Secondary | ICD-10-CM

## 2013-04-19 DIAGNOSIS — E119 Type 2 diabetes mellitus without complications: Secondary | ICD-10-CM

## 2013-04-19 LAB — CBC WITH DIFFERENTIAL/PLATELET
Basophils Absolute: 0 10*3/uL (ref 0.0–0.1)
Eosinophils Relative: 0 % (ref 0–5)
Lymphocytes Relative: 6 % — ABNORMAL LOW (ref 12–46)
MCV: 72.8 fL — ABNORMAL LOW (ref 78.0–100.0)
Neutrophils Relative %: 93 % — ABNORMAL HIGH (ref 43–77)
Platelets: 371 10*3/uL (ref 150–400)
RBC: 3.82 MIL/uL — ABNORMAL LOW (ref 3.87–5.11)
RDW: 18.1 % — ABNORMAL HIGH (ref 11.5–15.5)
WBC: 11.3 10*3/uL — ABNORMAL HIGH (ref 4.0–10.5)

## 2013-04-19 LAB — BASIC METABOLIC PANEL
BUN: 13 mg/dL (ref 6–23)
BUN: 13 mg/dL (ref 6–23)
BUN: 14 mg/dL (ref 6–23)
CO2: 21 mEq/L (ref 19–32)
Calcium: 8.8 mg/dL (ref 8.4–10.5)
Calcium: 9.2 mg/dL (ref 8.4–10.5)
Calcium: 9.4 mg/dL (ref 8.4–10.5)
Chloride: 86 mEq/L — ABNORMAL LOW (ref 96–112)
Creatinine, Ser: 1.14 mg/dL — ABNORMAL HIGH (ref 0.50–1.10)
Creatinine, Ser: 1.17 mg/dL — ABNORMAL HIGH (ref 0.50–1.10)
Creatinine, Ser: 1.34 mg/dL — ABNORMAL HIGH (ref 0.50–1.10)
GFR calc Af Amer: 57 mL/min — ABNORMAL LOW (ref >90)
GFR calc Af Amer: 59 mL/min — ABNORMAL LOW (ref >90)
GFR calc Af Amer: 57 mL/min — ABNORMAL LOW (ref >90)
GFR calc Af Amer: 49 mL/min — ABNORMAL LOW (ref >90)
GFR calc non Af Amer: 51 mL/min — ABNORMAL LOW (ref >90)
GFR calc non Af Amer: 50 mL/min — ABNORMAL LOW (ref >90)
GFR calc non Af Amer: 42 mL/min — ABNORMAL LOW (ref >90)
Glucose, Bld: 394 mg/dL — ABNORMAL HIGH (ref 70–99)
Potassium: 4.7 mEq/L (ref 3.5–5.1)
Potassium: 4.7 mEq/L (ref 3.5–5.1)
Sodium: 118 mEq/L — CL (ref 135–145)
Sodium: 127 mEq/L — ABNORMAL LOW (ref 135–145)

## 2013-04-19 LAB — URINALYSIS, ROUTINE W REFLEX MICROSCOPIC
Ketones, ur: 15 mg/dL — AB
Leukocytes, UA: NEGATIVE
Nitrite: NEGATIVE
Protein, ur: NEGATIVE mg/dL
Urobilinogen, UA: 0.2 mg/dL (ref 0.0–1.0)
pH: 5.5 (ref 5.0–8.0)

## 2013-04-19 LAB — PROTIME-INR
INR: 1.09 (ref 0.00–1.49)
Prothrombin Time: 13.9 seconds (ref 11.6–15.2)

## 2013-04-19 LAB — TSH: TSH: 0.595 u[IU]/mL (ref 0.350–4.500)

## 2013-04-19 LAB — LACTATE DEHYDROGENASE: LDH: 213 U/L (ref 94–250)

## 2013-04-19 LAB — HEMOGLOBIN A1C: Mean Plasma Glucose: 171 mg/dL — ABNORMAL HIGH (ref <117)

## 2013-04-19 LAB — GLUCOSE, CAPILLARY: Glucose-Capillary: 338 mg/dL — ABNORMAL HIGH (ref 70–99)

## 2013-04-19 LAB — SODIUM, URINE, RANDOM: Sodium, Ur: <10 mEq/L

## 2013-04-19 LAB — IRON AND TIBC: UIBC: 494 ug/dL — ABNORMAL HIGH (ref 125–400)

## 2013-04-19 LAB — OSMOLALITY: Osmolality: 261 mOsm/kg — ABNORMAL LOW (ref 275–300)

## 2013-04-19 MED ORDER — PREDNISONE 50 MG PO TABS
50.0000 mg | ORAL_TABLET | Freq: Every day | ORAL | Status: DC
  Administered 2013-04-20: 50 mg via ORAL
  Filled 2013-04-19 (×2): qty 1

## 2013-04-19 MED ORDER — IOHEXOL 350 MG/ML SOLN
80.0000 mL | Freq: Once | INTRAVENOUS | Status: AC | PRN
  Administered 2013-04-19: 80 mL via INTRAVENOUS

## 2013-04-19 MED ORDER — LIVING WELL WITH DIABETES BOOK
Freq: Once | Status: AC
  Administered 2013-04-19: 06:00:00
  Filled 2013-04-19: qty 1

## 2013-04-19 NOTE — Progress Notes (Addendum)
[facility] PROGRESS NOTE  Shari Brandt ZOX:[id number] DOB: September 09, 1951 DOA: 04/18/2013 PCP: Lonia Blood, MD  Assessment/Plan: Hyponatremia Given that findings with low serum osmolality, the uterine osmolality and urine sodium less than 20 differential includes primary polydipsia. Beer potamania is another possibility however she informs and drinking avout 40oz beer only once a week or so.  -TSH and Serum albumin is a normal. Patient clinically stable. Placed on strict  fluid restriction. UA unremarkable except for high urine glucose. Stable for transfer to telemetry. -If sodium level unimproved despite fluid restriction we'll get renal consultation.  Bilateral leg swellings The patient presented with mixed feelings earlier this month and had a negative Doppler of her lower extremity. She also reported of some chest discomfort and had a CT angiogram of the chest given elevated d-dimer which was unremarkable. She reports having leg swellings and dyspnea on exertion for the past several months. Has been on Lasix off and on. Reports that her leg swelling is worsened after being on Lyrica for her leg pain 1 year back. She informs having cardiac workup including 2-D echo and? Stress test by Dr Jacinto Halim about 6-8 months back. She does not know the results. I will obtain the results from Dr. Jacinto Halim. -TSH within normal limits. Pro BNP minimally intubated postop  Upper lips and facial swelling. Symptoms have resolved. Patient on Benadryl and prednisone. Reports being on lisinopril for past 2 months which I will discontinue.  Diabetes mellitus Patient is on Lantus 15 units at bedtime which I would continue. She's also on metformin 1000 mg twice daily. Check hemoglobin A1c. Continue sliding scale insulin.  Chest pain Present on admission and likely secondary to dyspepsia. Now resolved. Serial cardiac enzymes negative. CT abdomen and pelvis done on admission was unremarkable as well.  Iron  deficiency anemia. Check stool for occult blood.  Code Status: Full code Family Communication: None at bedside Disposition Plan: Home once stable   Consultants:  None  Procedures:  None  Antibiotics:  None  HPI/Subjective: Admission H&P reviewed. Patient denies chest pain. Patient swelling has resolved.  Objective: Filed Vitals:   04/19/13 0100 04/19/13 0200 04/19/13 0438 04/19/13 0746  BP: 135/56 140/65 146/73 158/64  Pulse: 110  104 89  Temp:   98 F (36.7 C) 97.5 F (36.4 C)  TempSrc:   Oral Oral  Resp:   18 18  Height:      Weight:   100.6 kg (221 lb 12.5 oz)   SpO2: 97%  99% 97%    Intake/Output Summary (Last 24 hours) at 04/19/13 1448 Last data filed at 04/19/13 1100  Gross per 24 hour  Intake 498.33 ml  Output      0 ml  Net 498.33 ml   Filed Weights   04/18/13 2123 04/19/13 0438  Weight: 99.9 kg (220 lb 3.8 oz) 100.6 kg (221 lb 12.5 oz)    Exam:   General:  Mediated will be seen in no acute distress  HEENT: No pallor, moist oral mucosa, no JVD  Chest: Clear to auscultation bilaterally, no added sounds  CVS: Normal S1 and S2, no murmurs rub or gallop  Abdomen: Soft, nontender, nondistended, bowel sounds present  Extremities: 1+ edema bilaterally, mild tenderness to pressure, distal pulses palpable  CNS: AAO x3    Data Reviewed: Basic Metabolic Panel:  Recent Labs Lab 04/18/13 1430 04/18/13 2055 04/18/13 2234 04/19/13 0430 04/19/13 1030  NA 121* 119*  --  118* 122*  K 4.9 4.8  --  4.7  4.7  CL 84* 85*  --  86* 87*  CO2 23 21  --  20 20  GLUCOSE 124* 235*  --  390* 394*  BUN 9 9  --  13 13  CREATININE 0.97 0.91 0.94 1.18* 1.14*  CALCIUM 9.3 9.0  --  8.7 8.8   Liver Function Tests:  Recent Labs Lab 04/18/13 1430  AST 25  ALT 11  ALKPHOS 109  BILITOT 0.3  PROT 7.3  ALBUMIN 3.9   No results found for this basename: LIPASE, AMYLASE,  in the last 168 hours No results found for this basename: AMMONIA,  in the last 168  hours CBC:  Recent Labs Lab 04/18/13 1430 04/18/13 2234 04/19/13 0430  WBC 8.9 9.6 11.3*  NEUTROABS 5.8  --  10.5*  HGB 8.9* 9.2* 8.9*  HCT 27.5* 27.8* 27.8*  MCV 72.9* 72.4* 72.8*  PLT 376 379 371   Cardiac Enzymes:  Recent Labs Lab 04/18/13 2234 04/19/13 0430 04/19/13 1030  TROPONINI <0.30 <0.30 <0.30   BNP (last 3 results)  Recent Labs  04/19/13 1030  PROBNP 90.9   CBG:  Recent Labs Lab 04/18/13 2130 04/19/13 0847 04/19/13 1306  GLUCAP 282* 338* 289*    Recent Results (from the past 240 hour(s))  MRSA PCR SCREENING     Status: None   Collection Time    04/18/13  9:36 PM      Result Value Range Status   MRSA by PCR NEGATIVE  NEGATIVE Final   Comment:            The GeneXpert MRSA Assay (FDA     approved for NASAL specimens     only), is one component of a     comprehensive MRSA colonization     surveillance program. It is not     intended to diagnose MRSA     infection nor to guide or     monitor treatment for     MRSA infections.     Studies: Dg Chest 2 View  04/18/2013   *RADIOLOGY REPORT*  Clinical Data: Pain, difficulty breathing  CHEST - 2 VIEW  Comparison:  None  Findings: The cardiomediastinal silhouette is unremarkable.  No acute infiltrate or pleural effusion.  Linear atelectasis or scarring noted lingula.  IMPRESSION: No active disease.  Linear atelectasis or scarring in the lingula.   Original Report Authenticated By: Natasha Mead, M.D.   Ct Angio Chest Pe W/cm &/or Wo Cm  04/19/2013   *RADIOLOGY REPORT*  Clinical Data: Chest pain and leg swelling.  Elevated D-dimer.  CT ANGIOGRAPHY CHEST  Technique:  Multidetector CT imaging of the chest using the standard protocol during bolus administration of intravenous contrast. Multiplanar reconstructed images including MIPs were obtained and reviewed to evaluate the vascular anatomy.  Contrast: 80mL OMNIPAQUE IOHEXOL 350 MG/ML SOLN  Comparison: None.  Findings:  The lower chest was not imaged,  but was  seen on CT of the abdomen and pelvis from 1 day prior.  THORACIC INLET/BODY WALL:  No acute abnormality.  MEDIASTINUM:  Normal heart size.  No pericardial effusion.  There is significant respiratory and cardiac motion, limiting assessment of the pulmonary arterial tree beyond the proximal segmental pulmonary arteries.  Even some the segmental pulmonary arteries are distorted by motion, in particular the right upper lobe and lingula. Within this limitation, no pulmonary embolism. No acute aortic abnormality.  No adenopathy.  LUNG WINDOWS:  No consolidation.  No effusion.  No suspicious pulmonary nodule. There is patchy  ground-glass attenuation in the upper lungs, likely atelectasis or air trapping.  UPPER ABDOMEN:  No acute findings.  OSSEOUS:  No acute fracture.  No suspicious lytic or blastic lesions. Remote posterior right fifth and sixth rib fractures.  IMPRESSION:  Negative for pulmonary embolism.  Sensitivity beyond the proximal segmental pulmonary arteries is significantly diminished by motion.   Original Report Authenticated By: Tiburcio Pea   Ct Abdomen Pelvis W Contrast  04/18/2013   *RADIOLOGY REPORT*  Clinical Data: Abdominal distention, nausea and diarrhea.  CT ABDOMEN AND PELVIS WITH CONTRAST  Technique:  Multidetector CT imaging of the abdomen and pelvis was performed following the standard protocol during bolus administration of intravenous contrast.  Contrast: OMNIPAQUE IOHEXOL 300 MG/ML  SOLN  Comparison: None.  Findings: The liver, gallbladder, pancreas, spleen, adrenal glands and kidneys are unremarkable.  Bowel shows a large amount of fecal material throughout the proximal two thirds of the colon without evidence of obvious lesion or bowel obstruction.  Small bowel is unremarkable.  No acute inflammatory process or abnormal fluid collection is identified.  No masses or enlarged lymph nodes are seen.  No evidence of hernia.  The bladder is unremarkable.  Adnexal regions show some cystic  changes especially on the left, where a dominant superior adnexal cyst measures approximately 4 cm in greatest diameter.  There is no associated free fluid in the pelvis.  No bony abnormalities are identified.  IMPRESSION: No acute findings.  Moderate fecal material throughout the proximal two thirds of the colon.  Left adnexal cyst measuring approximately 4 cm.   Original Report Authenticated By: Irish Lack, M.D.    Scheduled Meds: . enoxaparin (LOVENOX) injection  40 mg Subcutaneous Q24H  . famotidine (PEPCID) IV  20 mg Intravenous Q12H  . insulin aspart  0-5 Units Subcutaneous QHS  . insulin aspart  0-9 Units Subcutaneous TID WC  . insulin glargine  15 Units Subcutaneous QHS  . methylPREDNISolone (SOLU-MEDROL) injection  40 mg Intravenous Q12H  . sodium chloride  3 mL Intravenous Q12H   Continuous Infusions:     Time spent: 25 minutes    Liannah Yarbough  [facility] Pager [phone] If 7PM-7AM, please contact night-coverage at [email or web address], password [facility] 04/19/2013, 2:48 PM  LOS: 1 day

## 2013-04-19 NOTE — Progress Notes (Signed)
Inpatient Diabetes Program Recommendations  AACE/ADA: New Consensus Statement on Inpatient Glycemic Control (2013)  Target Ranges:  Prepandial:   less than 140 mg/dL      Peak postprandial:   less than 180 mg/dL (1-2 hours)      Critically ill patients:  140 - 180 mg/dL   Reason for Visit: Hyperglycemia Results for AMIYRAH, [name] (MRN [id number]) as of 04/19/2013 16:18  Ref. Range 04/18/2013 14:30 04/18/2013 20:55 04/19/2013 04:30 04/19/2013 10:30  Glucose Latest Range: 70-99 mg/dL 295 (H) 621 (H) 308 (H) 394 (H)  Results for [name], [name] (MRN [id number]) as of 04/19/2013 16:18  Ref. Range 04/19/2013 10:30  Sodium Latest Range: 135-145 mEq/L 122 (L)  Potassium Latest Range: 3.5-5.1 mEq/L 4.7  Chloride Latest Range: 96-112 mEq/L 87 (L)  CO2 Latest Range: 19-32 mEq/L 20  BUN Latest Range: 6-23 mg/dL 13  Creatinine Latest Range: 0.50-1.10 mg/dL 9.52 (H)  Calcium Latest Range: 8.4-10.5 mg/dL 8.8  GFR calc non Af Amer Latest Range: >90 mL/min 51 (L)  GFR calc Af Amer Latest Range: >90 mL/min 59 (L)  Glucose Latest Range: 70-99 mg/dL 841 (H)     Inpatient Diabetes Program Recommendations Insulin - Basal: Increase Lantus to 20 units QHS Correction (SSI): Increase Novolog to moderate tidwc and hs Diet: Add CHO mod med to heart healthy diet  Note: Will follow.  Thank you. Ailene Ards, RD, LDN, CDE Inpatient Diabetes Coordinator [phone]

## 2013-04-19 NOTE — Progress Notes (Signed)
Bilateral lower extremity venous duplex:  No evidence of DVT, superficial thrombosis, or Baker's Cyst.   

## 2013-04-20 DIAGNOSIS — D509 Iron deficiency anemia, unspecified: Secondary | ICD-10-CM | POA: Diagnosis present

## 2013-04-20 LAB — GLUCOSE, CAPILLARY: Glucose-Capillary: 171 mg/dL — ABNORMAL HIGH (ref 70–99)

## 2013-04-20 LAB — BASIC METABOLIC PANEL
BUN: 10 mg/dL (ref 6–23)
CO2: 23 mEq/L (ref 19–32)
CO2: 25 mEq/L (ref 19–32)
CO2: 21 mEq/L (ref 19–32)
Calcium: 9.2 mg/dL (ref 8.4–10.5)
Calcium: 9.6 mg/dL (ref 8.4–10.5)
Chloride: 93 mEq/L — ABNORMAL LOW (ref 96–112)
Creatinine, Ser: 1.22 mg/dL — ABNORMAL HIGH (ref 0.50–1.10)
Creatinine, Ser: 1.14 mg/dL — ABNORMAL HIGH (ref 0.50–1.10)
Creatinine, Ser: 1.06 mg/dL (ref 0.50–1.10)
GFR calc non Af Amer: 47 mL/min — ABNORMAL LOW (ref >90)
GFR calc non Af Amer: 51 mL/min — ABNORMAL LOW (ref >90)
Glucose, Bld: 184 mg/dL — ABNORMAL HIGH (ref 70–99)
Glucose, Bld: 169 mg/dL — ABNORMAL HIGH (ref 70–99)
Glucose, Bld: 359 mg/dL — ABNORMAL HIGH (ref 70–99)

## 2013-04-20 MED ORDER — FAMOTIDINE 20 MG PO TABS: 20.0000 mg | ORAL_TABLET | Freq: Two times a day (BID) | ORAL | Status: AC

## 2013-04-20 MED ORDER — DOCUSATE SODIUM 100 MG PO CAPS: 100.0000 mg | ORAL_CAPSULE | Freq: Two times a day (BID) | ORAL | Status: DC

## 2013-04-20 MED ORDER — INSULIN GLARGINE 100 UNIT/ML ~~LOC~~ SOLN
20.0000 [IU] | Freq: Every day | SUBCUTANEOUS | Status: DC
  Filled 2013-04-20: qty 0.2

## 2013-04-20 MED ORDER — INSULIN GLARGINE 100 UNIT/ML ~~LOC~~ SOLN: 15.0000 [IU] | Freq: Every day | SUBCUTANEOUS | Status: AC

## 2013-04-20 MED ORDER — DOCUSATE SODIUM 100 MG PO CAPS: 100.0000 mg | ORAL_CAPSULE | Freq: Two times a day (BID) | ORAL | Status: AC

## 2013-04-20 MED ORDER — PREDNISONE 50 MG PO TABS: ORAL_TABLET | ORAL | Status: DC

## 2013-04-20 NOTE — Progress Notes (Signed)
Utilization Review Completed Lamari Youngers J. Ehab Humber, RN, BSN, NCM [phone]  

## 2013-04-20 NOTE — Discharge Summary (Signed)
Physician Discharge Summary  Shari Brandt EAV:[id number] DOB: February 26, 1951 DOA: 04/18/2013  PCP: Lonia Blood, MD  Admit date: 04/18/2013 Discharge date: 04/20/2013  Time spent: 40 minutes  Recommendations for Outpatient Follow-up:  1. Discharge home with outpt  Discharge Diagnoses:  Principal Problem:   Angioedema of lips  Active Problems:   Hyponatremia   Constipation   Chest pain   Abdominal pain   Diabetes mellitus   Neuropathy   Mood disorder   Iron deficiency anemia   Discharge Condition: fair  Diet recommendation: diabetic with fluid restriction  Filed Weights   04/18/13 2123 04/19/13 0438  Weight: 99.9 kg (220 lb 3.8 oz) 100.6 kg (221 lb 12.5 oz)    History of present illness:  Please refer to admission H&P for details, but in brief, 62 y.o. female with Past medical history of diabetes mellitus, hypertension, sleep apnea, diastolic heart failure, chronic pain, on chronic estrogen and  obesity.  presented to the ER with the complaint of chest pain that started 3 days ago. The pain felt like heaviness and pressure along with indigestion and has been ongoing on and off. This morning when she woke up she saw that her right side of the face has swollen with the swelling of the lip. The swelling was progressively worsening and then she came to the ER after receiving Benadryl the swelling resolved but then she had similar swelling of the left side of the face. She denies any trouble breathing or choking on her food. She denies any similar swelling in the past. She has been on lisinopril since last few months. She has been on Lasix on and off.  The patient also had nausea with sense of fullness of her stomach since last 3 days. The fullness of the stomach she has been feeling since last few months. .  She has been on estrogen since last many years in mentions that without it she has hot flashes. She had bilateral leg swelling, with more severe swelling in her left leg and had  a duplex which was negative for the left leg for any DVT in the beginning of this month.  Course in the ED Patient given solumedrol, pepcid and benadryl for her angioedema symptoms. A ct abd and pelvis done in ED was unremarkable. Her D dimer was positive and given chest pain sx a CT angio chest was done which was negative for PE. Patient noted to have hyponatremia with sodium of 119. [facility] called for admission to stepdown for closer monitoring.     Hospital Course:  Hyponatremia  -with  findings with low serum osmolality,urine  osmolality and urine sodium less than 20 differential includes primary polydipsia. Beer potamania is another possibility however she informs and drinking about 40oz beer only once a week or so.  -TSH and Serum albumin were  normal.  Patient clinically stable. she was Placed on strict fluid restriction. UA unremarkable except for high urine glucose.  -Na level appropriately improved to 133 with fluid restriction. It appears she has been drinking several litre of water to keep her hydrated daily. Have counseled on cutting down her water intake. Also counseled on cessation of  Beer/ etoh  intake.   Bilateral leg swellings  The patient presented with leg swelling earlier this month and had a negative Doppler of her lower extremity. She also reported of some chest discomfort and had a CT angiogram of the chest given elevated d-dimer which was unremarkable. repeat Doppler lower extremity was negative for DVT  She reports having leg swellings and dyspnea on exertion for the past several months. Has been on Lasix off and on. Reports that her leg swelling is worsened after being on Lyrica for her leg pain 1 year back.  -i spoke to her cardiologist Dr Jacinto Halim who faxed me her records. In October 2013, She had a 2-D echo done with normal EF. Also had Lexi scan done which was negative for ischemia. -TSH within normal limits. Pro BNP minimally elevated. -She does not have any  signs of heart failure. I have recommended her to continue with her Lasix for her leg swellings and follow up with her PCP as outpatient  Upper lips and facial swelling.  Possibly angioedema secondary to ACE inhibitor. Symptoms have resolved. Patient on Benadryl , Pepcid and prednisone. Reports being on lisinopril for past few months which I will discontinue.  I would start her on a few days of prednisone and Pepcid.  Diabetes mellitus  Patient is on Lantus 15 units at bedtime which I would continue. She's also on metformin 1000 mg twice daily. Of an A1c of 7.9. We'll increase her Lantus to 20 units daily.  Chest pain  Present on admission and likely secondary to dyspepsia. Now resolved. Serial cardiac enzymes negative. CT abdomen and pelvis done on admission was unremarkable as well.   Iron deficiency anemia.  Stool for occult blood ordered but has not been sent out this admission. Iron panel suggests iron deficiency anemia. Currently sees constipated and a lot start her on iron supplements. Once her symptoms are better she should be started on iron supplements as outpatient.  constipation Will order Colace upon discharge  Code Status: Full code  Family Communication: None at bedside  Disposition Plan: Home with outpatient PCP followup   Consultants:  None Procedures:  None Antibiotics:  None     Discharge Exam: Filed Vitals:   04/19/13 1900 04/19/13 2323 04/20/13 0554 04/20/13 0800  BP: 164/64 154/60 162/74 149/75  Pulse: 95 87 79 93  Temp: 97.7 F (36.5 C) 98 F (36.7 C) 98.4 F (36.9 C) 98.1 F (36.7 C)  TempSrc: Oral Oral Oral Oral  Resp: 20 18 18 18   Height:      Weight:      SpO2: 100% 100% 100% 98%    General: Mediated will be seen in no acute distress  HEENT: No pallor, moist oral mucosa, no JVD  Chest: Clear to auscultation bilaterally, no added sounds  CVS: Normal S1 and S2, no murmurs rub or gallop  Abdomen: Soft, nontender, nondistended, bowel  sounds present Extremities: Trace edema bilaterally, mild tenderness to pressure, distal pulses palpable CNS: AAO x3   Discharge Instructions     Medication List    STOP taking these medications       lisinopril 5 MG tablet  Commonly known as:  PRINIVIL,ZESTRIL      TAKE these medications       carisoprodol 350 MG tablet  Commonly known as:  SOMA  Take 350 mg by mouth 2 (two) times daily.     docusate sodium 100 MG capsule  Commonly known as:  COLACE  Take 1 capsule (100 mg total) by mouth 2 (two) times daily.     estradiol 2 MG tablet  Commonly known as:  ESTRACE  Take 2 mg by mouth daily.     EXCEDRIN EXTRA STRENGTH PO  Take 2 tablets by mouth as needed (for pain).     famotidine 20 MG tablet  Commonly  known as:  PEPCID  Take 1 tablet (20 mg total) by mouth 2 (two) times daily.     furosemide 40 MG tablet  Commonly known as:  LASIX  Take 40 mg by mouth 2 (two) times daily.     insulin glargine 100 UNIT/ML injection  Commonly known as:  LANTUS  Inject 0.15 mLs (15 Units total) into the skin at bedtime.     metFORMIN 1000 MG tablet  Commonly known as:  GLUCOPHAGE  Take 1,000 mg by mouth 2 (two) times daily with a meal.     OVER THE COUNTER MEDICATION  Apply 1 application topically as needed (for pain).     oxyCODONE 10 MG 12 hr tablet  Commonly known as:  OXYCONTIN  Take 10 mg by mouth every 6 (six) hours as needed for pain.     potassium chloride SA 20 MEQ tablet  Commonly known as:  K-DUR,KLOR-CON  Take 20 mEq by mouth 2 (two) times daily.     predniSONE 50 MG tablet  Commonly known as:  DELTASONE  1 tablet daily for 4 daya     pregabalin 300 MG capsule  Commonly known as:  LYRICA  Take 300 mg by mouth 2 (two) times daily.     SAVELLA 50 MG Tabs  Generic drug:  Milnacipran  Take 50 mg by mouth 2 (two) times daily.       Allergies  Allergen Reactions  . Asa (Aspirin) Swelling  . Lisinopril Swelling  . Motrin (Ibuprofen) Swelling     Stomach swell       Follow-up Information   Follow up with GARBA,LAWAL, MD In 1 week.   Contact information:   [address]  [place] [address] [phone]        The results of significant diagnostics from this hospitalization (including imaging, microbiology, ancillary and laboratory) are listed below for reference.    Significant Diagnostic Studies: Dg Chest 2 View  04/18/2013   *RADIOLOGY REPORT*  Clinical Data: Pain, difficulty breathing  CHEST - 2 VIEW  Comparison:  None  Findings: The cardiomediastinal silhouette is unremarkable.  No acute infiltrate or pleural effusion.  Linear atelectasis or scarring noted lingula.  IMPRESSION: No active disease.  Linear atelectasis or scarring in the lingula.   Original Report Authenticated By: Natasha Mead, M.D.   Ct Angio Chest Pe W/cm &/or Wo Cm  04/19/2013   *RADIOLOGY REPORT*  Clinical Data: Chest pain and leg swelling.  Elevated D-dimer.  CT ANGIOGRAPHY CHEST  Technique:  Multidetector CT imaging of the chest using the standard protocol during bolus administration of intravenous contrast. Multiplanar reconstructed images including MIPs were obtained and reviewed to evaluate the vascular anatomy.  Contrast: 80mL OMNIPAQUE IOHEXOL 350 MG/ML SOLN  Comparison: None.  Findings:  The lower chest was not imaged,  but was seen on CT of the abdomen and pelvis from 1 day prior.  THORACIC INLET/BODY WALL:  No acute abnormality.  MEDIASTINUM:  Normal heart size.  No pericardial effusion.  There is significant respiratory and cardiac motion, limiting assessment of the pulmonary arterial tree beyond the proximal segmental pulmonary arteries.  Even some the segmental pulmonary arteries are distorted by motion, in particular the right upper lobe and lingula. Within this limitation, no pulmonary embolism. No acute aortic abnormality.  No adenopathy.  LUNG WINDOWS:  No consolidation.  No effusion.  No suspicious pulmonary nodule. There is patchy  ground-glass attenuation in the upper lungs, likely atelectasis or air trapping.  UPPER  ABDOMEN:  No acute findings.  OSSEOUS:  No acute fracture.  No suspicious lytic or blastic lesions. Remote posterior right fifth and sixth rib fractures.  IMPRESSION:  Negative for pulmonary embolism.  Sensitivity beyond the proximal segmental pulmonary arteries is significantly diminished by motion.   Original Report Authenticated By: Tiburcio Pea   Ct Abdomen Pelvis W Contrast  04/18/2013   *RADIOLOGY REPORT*  Clinical Data: Abdominal distention, nausea and diarrhea.  CT ABDOMEN AND PELVIS WITH CONTRAST  Technique:  Multidetector CT imaging of the abdomen and pelvis was performed following the standard protocol during bolus administration of intravenous contrast.  Contrast: OMNIPAQUE IOHEXOL 300 MG/ML  SOLN  Comparison: None.  Findings: The liver, gallbladder, pancreas, spleen, adrenal glands and kidneys are unremarkable.  Bowel shows a large amount of fecal material throughout the proximal two thirds of the colon without evidence of obvious lesion or bowel obstruction.  Small bowel is unremarkable.  No acute inflammatory process or abnormal fluid collection is identified.  No masses or enlarged lymph nodes are seen.  No evidence of hernia.  The bladder is unremarkable.  Adnexal regions show some cystic changes especially on the left, where a dominant superior adnexal cyst measures approximately 4 cm in greatest diameter.  There is no associated free fluid in the pelvis.  No bony abnormalities are identified.  IMPRESSION: No acute findings.  Moderate fecal material throughout the proximal two thirds of the colon.  Left adnexal cyst measuring approximately 4 cm.   Original Report Authenticated By: Irish Lack, M.D.    Microbiology: Recent Results (from the past 240 hour(s))  MRSA PCR SCREENING     Status: None   Collection Time    04/18/13  9:36 PM      Result Value Range Status   MRSA by PCR NEGATIVE   NEGATIVE Final   Comment:            The GeneXpert MRSA Assay (FDA     approved for NASAL specimens     only), is one component of a     comprehensive MRSA colonization     surveillance program. It is not     intended to diagnose MRSA     infection nor to guide or     monitor treatment for     MRSA infections.     Labs: Basic Metabolic Panel:  Recent Labs Lab 04/19/13 1030 04/19/13 1650 04/19/13 2220 04/20/13 0100 04/20/13 0535  NA 122* 124* 127* 128* 133*  K 4.7 5.0 4.6 4.6 4.3  CL 87* 90* 92* 93* 96  CO2 20 21 21 23 25   GLUCOSE 394* 334* 198* 184* 169*  BUN 13 14 14 13 11   CREATININE 1.14* 1.17* 1.34* 1.22* 1.14*  CALCIUM 8.8 9.2 9.4 9.2 9.6   Liver Function Tests:  Recent Labs Lab 04/18/13 1430  AST 25  ALT 11  ALKPHOS 109  BILITOT 0.3  PROT 7.3  ALBUMIN 3.9   No results found for this basename: LIPASE, AMYLASE,  in the last 168 hours No results found for this basename: AMMONIA,  in the last 168 hours CBC:  Recent Labs Lab 04/18/13 1430 04/18/13 2234 04/19/13 0430  WBC 8.9 9.6 11.3*  NEUTROABS 5.8  --  10.5*  HGB 8.9* 9.2* 8.9*  HCT 27.5* 27.8* 27.8*  MCV 72.9* 72.4* 72.8*  PLT 376 379 371   Cardiac Enzymes:  Recent Labs Lab 04/18/13 2234 04/19/13 0430 04/19/13 1030  TROPONINI <0.30 <0.30 <0.30   BNP: BNP (  last 3 results)  Recent Labs  04/19/13 1030  PROBNP 90.9   CBG:  Recent Labs Lab 04/19/13 0847 04/19/13 1306 04/19/13 1736 04/19/13 2053 04/20/13 0837  GLUCAP 338* 289* 323* 215* 171*       Signed:  Fredy Gladu  [facility] 04/20/2013, 10:34 AM

## 2013-10-23 ENCOUNTER — Other Ambulatory Visit: Payer: Self-pay | Admitting: Internal Medicine

## 2013-10-23 DIAGNOSIS — Z1231 Encounter for screening mammogram for malignant neoplasm of breast: Secondary | ICD-10-CM

## 2013-11-13 ENCOUNTER — Ambulatory Visit: Payer: Medicare PPO

## 2013-11-30 ENCOUNTER — Ambulatory Visit: Payer: Medicare PPO

## 2013-12-04 ENCOUNTER — Ambulatory Visit: Payer: Medicare PPO

## 2013-12-25 ENCOUNTER — Ambulatory Visit: Payer: Medicare PPO

## 2014-03-26 IMAGING — CR DG CHEST 2V
2 series · 2 of 2 positions shown · non-contrast
Comparison: None

CLINICAL DATA: Pain, difficulty breathing

CHEST - 2 VIEW

[w chest pa]
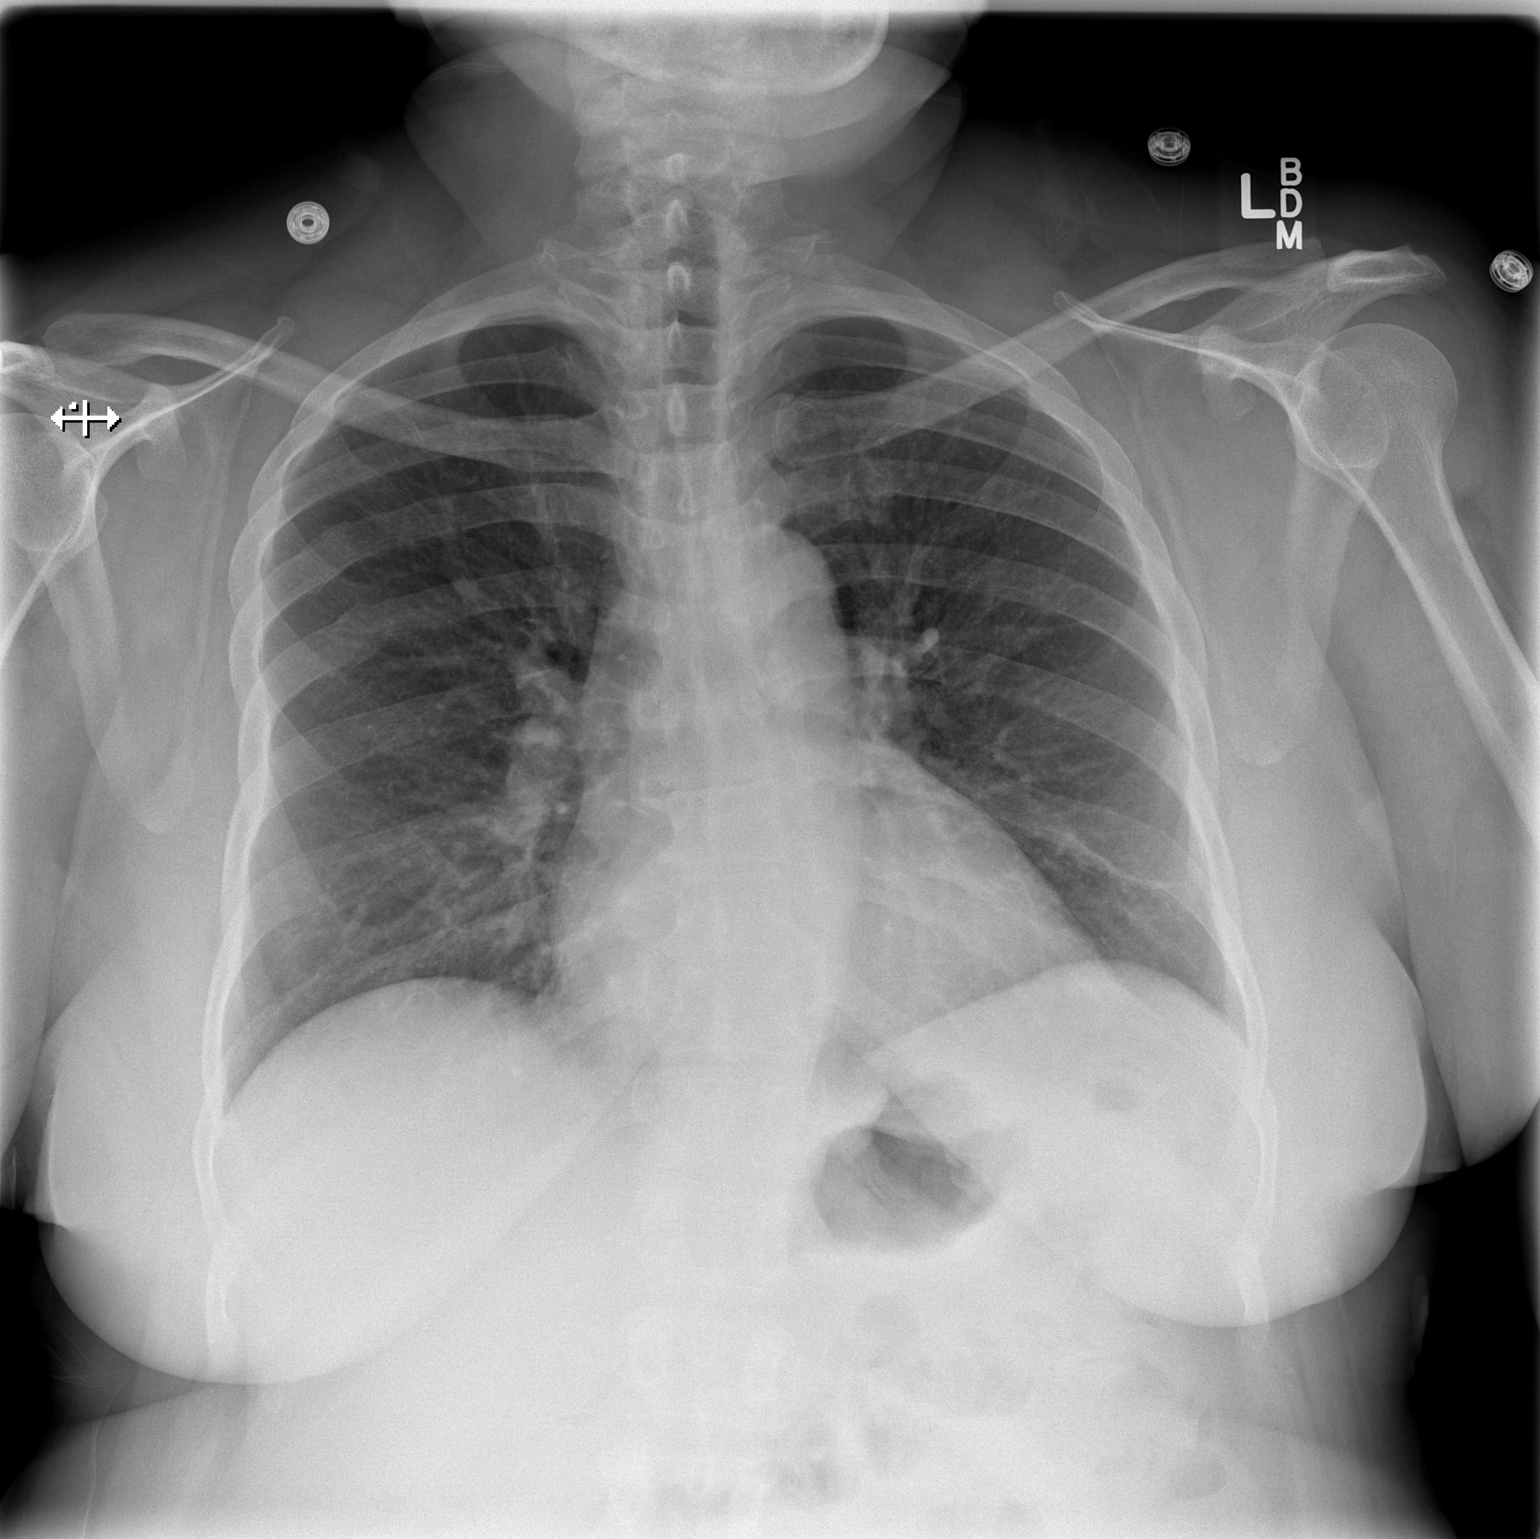

[w chest lat]
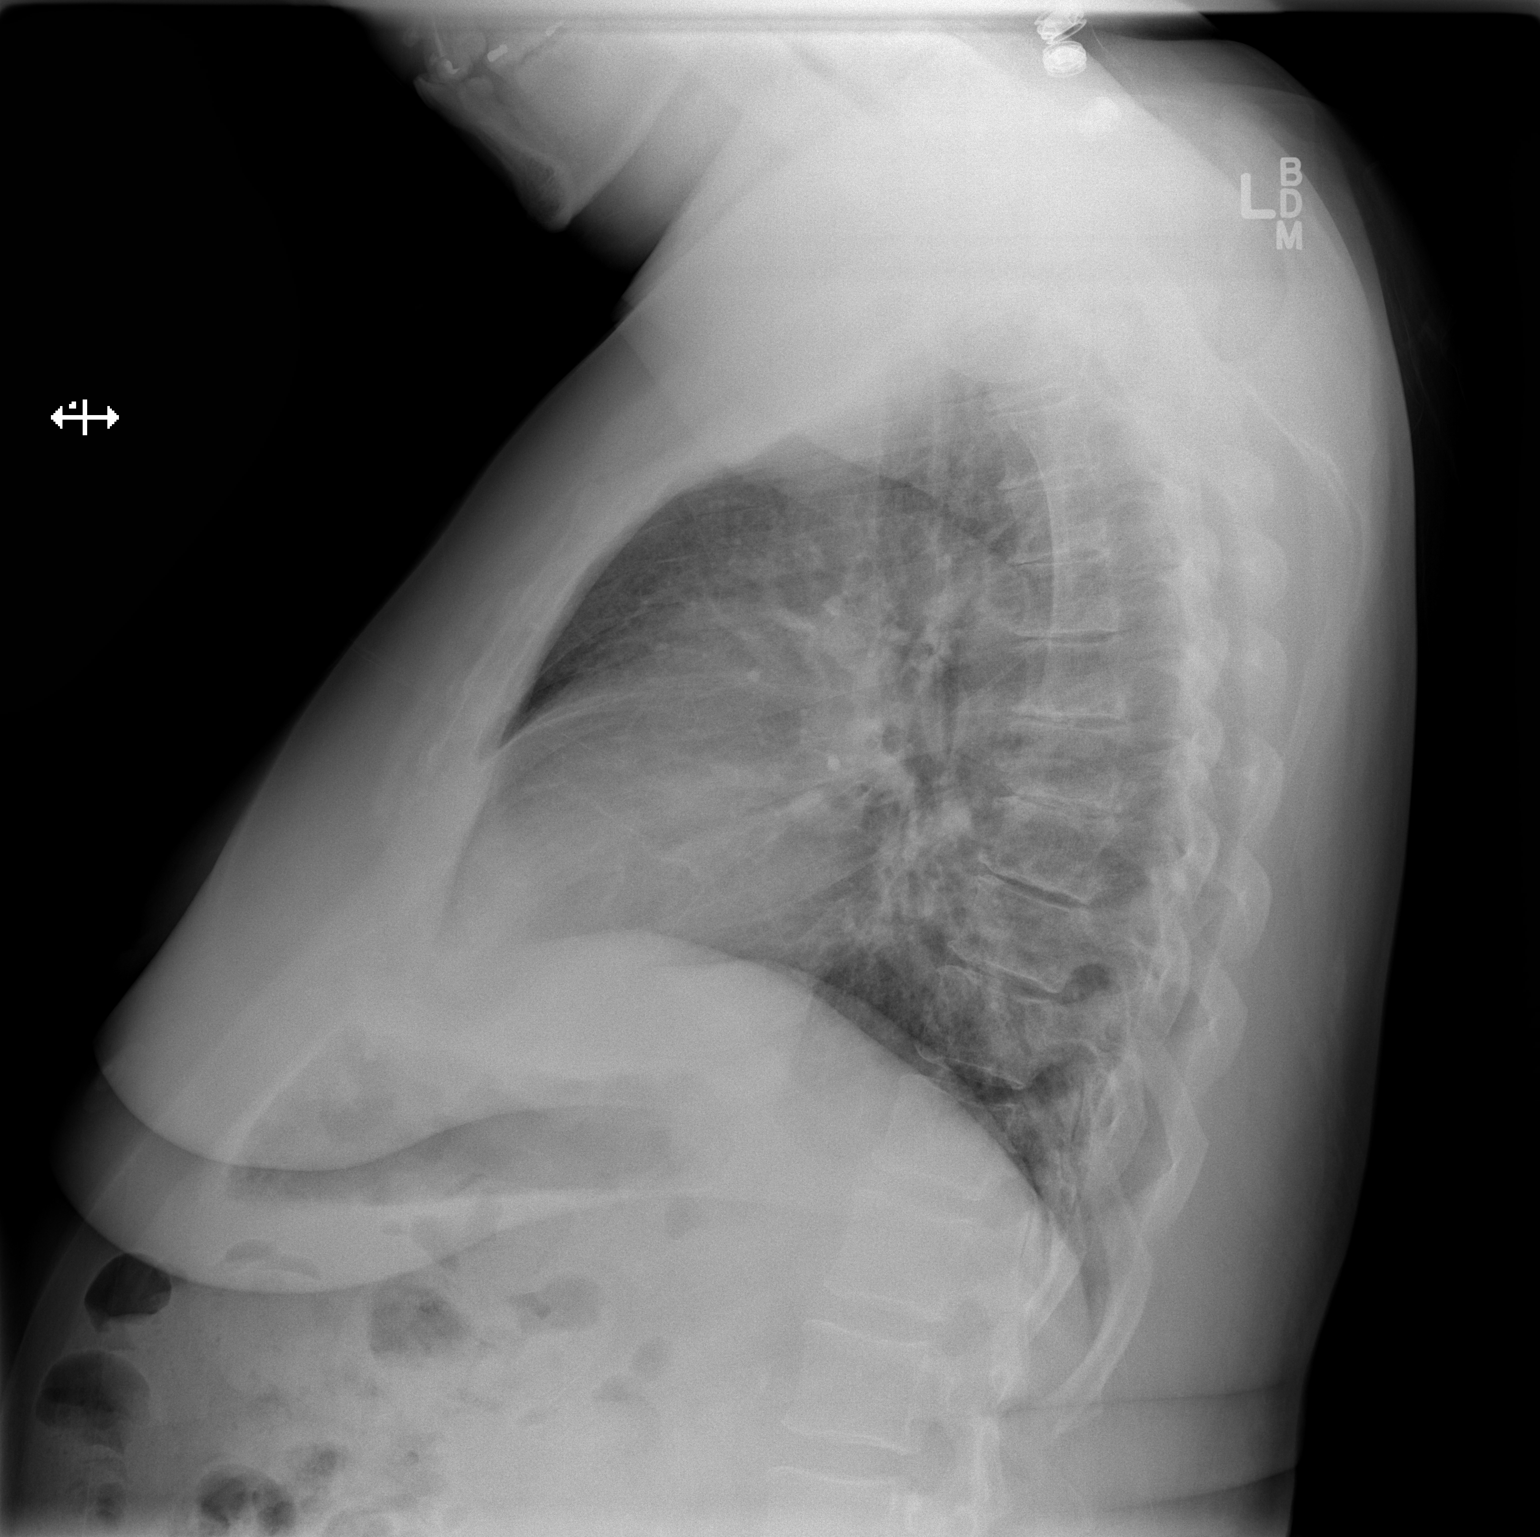

[2 of 2 positions shown; findings below may reference images not displayed]

FINDINGS: The cardiomediastinal silhouette is unremarkable.  No
acute infiltrate or pleural effusion.  Linear atelectasis or
scarring noted lingula.
IMPRESSION: No active disease.  Linear atelectasis or scarring in the lingula.

## 2014-11-02 ENCOUNTER — Emergency Department (HOSPITAL_COMMUNITY): Payer: Medicare PPO

## 2014-11-02 ENCOUNTER — Emergency Department (HOSPITAL_COMMUNITY)
Admission: EM | Admit: 2014-11-02 | Discharge: 2014-11-02 | Disposition: A | Discharge status: Home / Self Care | Payer: Medicare PPO | Attending: Emergency Medicine | Admitting: Emergency Medicine

## 2014-11-02 ENCOUNTER — Encounter (HOSPITAL_COMMUNITY): Payer: Self-pay | Admitting: *Deleted

## 2014-11-02 DIAGNOSIS — Y9289 Other specified places as the place of occurrence of the external cause: Secondary | ICD-10-CM | POA: Diagnosis not present

## 2014-11-02 DIAGNOSIS — E119 Type 2 diabetes mellitus without complications: Secondary | ICD-10-CM | POA: Diagnosis not present

## 2014-11-02 DIAGNOSIS — G8929 Other chronic pain: Secondary | ICD-10-CM | POA: Insufficient documentation

## 2014-11-02 DIAGNOSIS — S161XXA Strain of muscle, fascia and tendon at neck level, initial encounter: Secondary | ICD-10-CM | POA: Insufficient documentation

## 2014-11-02 DIAGNOSIS — T148 Other injury of unspecified body region: Secondary | ICD-10-CM | POA: Insufficient documentation

## 2014-11-02 DIAGNOSIS — S0990XA Unspecified injury of head, initial encounter: Secondary | ICD-10-CM | POA: Insufficient documentation

## 2014-11-02 DIAGNOSIS — S01511A Laceration without foreign body of lip, initial encounter: Secondary | ICD-10-CM | POA: Diagnosis not present

## 2014-11-02 DIAGNOSIS — T1490XA Injury, unspecified, initial encounter: Secondary | ICD-10-CM

## 2014-11-02 DIAGNOSIS — T148XXA Other injury of unspecified body region, initial encounter: Secondary | ICD-10-CM

## 2014-11-02 DIAGNOSIS — S0081XA Abrasion of other part of head, initial encounter: Secondary | ICD-10-CM | POA: Diagnosis not present

## 2014-11-02 DIAGNOSIS — S3992XA Unspecified injury of lower back, initial encounter: Secondary | ICD-10-CM | POA: Insufficient documentation

## 2014-11-02 DIAGNOSIS — Y998 Other external cause status: Secondary | ICD-10-CM | POA: Diagnosis not present

## 2014-11-02 DIAGNOSIS — S199XXA Unspecified injury of neck, initial encounter: Secondary | ICD-10-CM | POA: Insufficient documentation

## 2014-11-02 DIAGNOSIS — Y92009 Unspecified place in unspecified non-institutional (private) residence as the place of occurrence of the external cause: Secondary | ICD-10-CM | POA: Insufficient documentation

## 2014-11-02 DIAGNOSIS — M542 Cervicalgia: Secondary | ICD-10-CM

## 2014-11-02 DIAGNOSIS — Y9389 Activity, other specified: Secondary | ICD-10-CM | POA: Insufficient documentation

## 2014-11-02 HISTORY — DX: Type 2 diabetes mellitus without complications: E11.9

## 2014-11-02 MED ORDER — HYDROCODONE-ACETAMINOPHEN 5-325 MG PO TABS
1.0000 | ORAL_TABLET | Freq: Once | ORAL | Status: AC
  Administered 2014-11-02: 1 via ORAL

## 2014-11-02 MED ORDER — TETANUS-DIPHTH-ACELL PERTUSSIS 5-2.5-18.5 LF-MCG/0.5 IM SUSP
0.5000 mL | Freq: Once | INTRAMUSCULAR | Status: DC
  Filled 2014-11-02: qty 0.5

## 2014-11-02 MED ORDER — PREDNISONE 10 MG PO TABS: 40.0000 mg | ORAL_TABLET | Freq: Every day | ORAL | Status: AC

## 2014-11-02 MED ORDER — HYDROCODONE-ACETAMINOPHEN 5-325 MG PO TABS
1.0000 | ORAL_TABLET | Freq: Once | ORAL | Status: AC
  Administered 2014-11-02: 1 via ORAL
  Filled 2014-11-02: qty 1

## 2014-11-02 MED ORDER — KETOROLAC TROMETHAMINE 30 MG/ML IJ SOLN
30.0000 mg | Freq: Once | INTRAMUSCULAR | Status: AC
  Administered 2014-11-02: 30 mg via INTRAMUSCULAR
  Filled 2014-11-02: qty 1

## 2014-11-02 MED ORDER — PREDNISONE 20 MG PO TABS
60.0000 mg | ORAL_TABLET | Freq: Once | ORAL | Status: AC
  Administered 2014-11-02: 60 mg via ORAL
  Filled 2014-11-02: qty 3

## 2014-11-02 MED ORDER — OXYCODONE-ACETAMINOPHEN 5-325 MG PO TABS: 1.0000 | ORAL_TABLET | Freq: Four times a day (QID) | ORAL | Status: DC | PRN

## 2014-11-02 MED ORDER — HYDROCODONE-ACETAMINOPHEN 5-325 MG PO TABS
1.0000 | ORAL_TABLET | Freq: Once | ORAL | Status: DC
  Filled 2014-11-02: qty 1

## 2014-11-02 NOTE — ED Notes (Signed)
Patient transported to CT 

## 2014-11-02 NOTE — ED Notes (Signed)
Patient very argumentative during the DC process Patient upset that Toradol injection is not stronger than the Norco that she was previously given at 0437 and then the 2nd dose that was also given at 0547 during DC process Patient then upset that she can't sleep in the room until her daughter shows up Patient informed that once she is DC, that she can't sleep in the room until someone comes to pick her up Patient refusing to sign for papers and refusing DC VS  Patient then stated that she wanted me to leave the room and have someone else finish her DC papers ED Charge nurse made aware

## 2014-11-02 NOTE — ED Notes (Signed)
EDP at bedside  

## 2014-11-02 NOTE — ED Notes (Signed)
Order for Norco entered again due to chart merge

## 2014-11-02 NOTE — ED Notes (Signed)
Patient back from radiology.

## 2014-11-02 NOTE — ED Notes (Signed)
Patient medicated for right side face and neck pain Patient states that she "needs IV pain medication, because my pain is too severe. This little pill isn't going to do nothing." Patient asking to speak with EDP again because patient forgot to tell EDP about "pinched nerve in my shoulder" and her "severe pain" Will make EDP aware
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
## 2014-11-02 NOTE — ED Notes (Signed)
This nurse entered room to give patient additional Norco and Prednisone Patient stated, "She said that she was going to give me a shot" Patient informed that previous discussion was had with EDP about patient wanting stronger pain medication and that the EDP had ordered another tab of Norco and Prednisone Patient states that she wants to speak with EDP EDP made aware--Patient is take the medications as ordered and that EDP will be at beside shortly to speak with patient Patient informed that additional Norco and Prednisone is what has been ordered at this time and nothing else This nurse scanned the medications and then patient stated, "Well I'm not going to take any of those pills until she speaks to me and I got my shot." Patient [facility]
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
## 2014-11-02 NOTE — ED Notes (Signed)
While leaving ED patient asked both the ED Charge Nurse and another nursing staff member for my full name Patient also asked for my name during the DC process and patient informed that name would not be provided Shari Shileyiffany, RN ED Charge informed the patient that the name's of nursing staff will not and are not provided to patients Patient then stated that she didn't need my name anyway, because she "knows what I look like." Patient continued to be in NAD as she ambulated to ED waiting room--refused wheelchair
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
## 2014-11-08 ENCOUNTER — Encounter (HOSPITAL_COMMUNITY): Payer: Self-pay | Admitting: Radiology

## 2015-04-09 ENCOUNTER — Emergency Department (HOSPITAL_COMMUNITY)
Admission: EM | Admit: 2015-04-09 | Discharge: 2015-04-09 | Disposition: A | Discharge status: Home / Self Care | Payer: Medicare PPO | Attending: Emergency Medicine | Admitting: Emergency Medicine

## 2015-04-09 ENCOUNTER — Encounter (HOSPITAL_COMMUNITY): Payer: Self-pay | Admitting: Emergency Medicine

## 2015-04-09 ENCOUNTER — Emergency Department (HOSPITAL_COMMUNITY): Payer: Medicare PPO

## 2015-04-09 DIAGNOSIS — M797 Fibromyalgia: Secondary | ICD-10-CM | POA: Insufficient documentation

## 2015-04-09 DIAGNOSIS — G8929 Other chronic pain: Secondary | ICD-10-CM | POA: Insufficient documentation

## 2015-04-09 DIAGNOSIS — R531 Weakness: Secondary | ICD-10-CM | POA: Diagnosis not present

## 2015-04-09 DIAGNOSIS — Z7952 Long term (current) use of systemic steroids: Secondary | ICD-10-CM | POA: Diagnosis not present

## 2015-04-09 DIAGNOSIS — E119 Type 2 diabetes mellitus without complications: Secondary | ICD-10-CM | POA: Diagnosis not present

## 2015-04-09 DIAGNOSIS — K529 Noninfective gastroenteritis and colitis, unspecified: Secondary | ICD-10-CM | POA: Insufficient documentation

## 2015-04-09 DIAGNOSIS — J45909 Unspecified asthma, uncomplicated: Secondary | ICD-10-CM | POA: Insufficient documentation

## 2015-04-09 DIAGNOSIS — K219 Gastro-esophageal reflux disease without esophagitis: Secondary | ICD-10-CM | POA: Diagnosis not present

## 2015-04-09 DIAGNOSIS — G43909 Migraine, unspecified, not intractable, without status migrainosus: Secondary | ICD-10-CM | POA: Diagnosis not present

## 2015-04-09 DIAGNOSIS — R112 Nausea with vomiting, unspecified: Secondary | ICD-10-CM | POA: Diagnosis present

## 2015-04-09 DIAGNOSIS — M159 Polyosteoarthritis, unspecified: Secondary | ICD-10-CM | POA: Insufficient documentation

## 2015-04-09 DIAGNOSIS — I10 Essential (primary) hypertension: Secondary | ICD-10-CM | POA: Diagnosis not present

## 2015-04-09 DIAGNOSIS — R0789 Other chest pain: Secondary | ICD-10-CM | POA: Insufficient documentation

## 2015-04-09 DIAGNOSIS — D649 Anemia, unspecified: Secondary | ICD-10-CM | POA: Diagnosis not present

## 2015-04-09 DIAGNOSIS — Z79899 Other long term (current) drug therapy: Secondary | ICD-10-CM | POA: Diagnosis not present

## 2015-04-09 DIAGNOSIS — Z792 Long term (current) use of antibiotics: Secondary | ICD-10-CM | POA: Diagnosis not present

## 2015-04-09 LAB — CBC
HEMATOCRIT: 34.1 % — AB (ref 36.0–46.0)
Hemoglobin: 11.8 g/dL — ABNORMAL LOW (ref 12.0–15.0)
MCH: 31 pg (ref 26.0–34.0)
MCHC: 34.6 g/dL (ref 30.0–36.0)
MCV: 89.5 fL (ref 78.0–100.0)
Platelets: 257 10*3/uL (ref 150–400)
RBC: 3.81 MIL/uL — ABNORMAL LOW (ref 3.87–5.11)
RDW: 17.4 % — AB (ref 11.5–15.5)
WBC: 6.8 10*3/uL (ref 4.0–10.5)

## 2015-04-09 LAB — I-STAT TROPONIN, ED: Troponin i, poc: 0.01 ng/mL (ref 0.00–0.08)

## 2015-04-09 LAB — BASIC METABOLIC PANEL
Anion gap: 12 (ref 5–15)
CO2: 20 mmol/L — ABNORMAL LOW (ref 22–32)
Calcium: 8.2 mg/dL — ABNORMAL LOW (ref 8.9–10.3)
Chloride: 98 mmol/L — ABNORMAL LOW (ref 101–111)
Creatinine, Ser: 0.81 mg/dL (ref 0.44–1.00)
GFR calc Af Amer: >60 mL/min (ref >60)
GLUCOSE: 88 mg/dL (ref 65–99)
POTASSIUM: 4.9 mmol/L (ref 3.5–5.1)
Sodium: 130 mmol/L — ABNORMAL LOW (ref 135–145)

## 2015-04-09 LAB — HEPATIC FUNCTION PANEL
ALBUMIN: 3.1 g/dL — AB (ref 3.5–5.0)
ALK PHOS: 167 U/L — AB (ref 38–126)
ALT: 31 U/L (ref 14–54)
AST: 75 U/L — ABNORMAL HIGH (ref 15–41)
BILIRUBIN DIRECT: 0.7 mg/dL — AB (ref 0.1–0.5)
BILIRUBIN TOTAL: 1.8 mg/dL — AB (ref 0.3–1.2)
Indirect Bilirubin: 1.1 mg/dL — ABNORMAL HIGH (ref 0.3–0.9)
Total Protein: 5.5 g/dL — ABNORMAL LOW (ref 6.5–8.1)

## 2015-04-09 LAB — LIPASE, BLOOD: Lipase: 35 U/L (ref 22–51)

## 2015-04-09 MED ORDER — MORPHINE SULFATE 4 MG/ML IJ SOLN
4.0000 mg | Freq: Once | INTRAMUSCULAR | Status: AC
  Administered 2015-04-09: 4 mg via INTRAVENOUS
  Filled 2015-04-09: qty 1

## 2015-04-09 MED ORDER — PANTOPRAZOLE SODIUM 20 MG PO TBEC: 20.0000 mg | DELAYED_RELEASE_TABLET | Freq: Every day | ORAL | Status: AC

## 2015-04-09 MED ORDER — IOHEXOL 350 MG/ML SOLN
100.0000 mL | Freq: Once | INTRAVENOUS | Status: AC | PRN
  Administered 2015-04-09: 100 mL via INTRAVENOUS

## 2015-04-09 MED ORDER — ONDANSETRON 4 MG PO TBDP: 4.0000 mg | ORAL_TABLET | ORAL | Status: AC | PRN

## 2015-04-09 MED ORDER — OXYCODONE-ACETAMINOPHEN 5-325 MG PO TABS: 2.0000 | ORAL_TABLET | ORAL | Status: DC | PRN

## 2015-04-09 NOTE — Discharge Instructions (Signed)
Gastroesophageal Reflux Disease, Adult Gastroesophageal reflux disease (GERD) happens when acid from your stomach flows up into the esophagus. When acid comes in contact with the esophagus, the acid causes soreness (inflammation) in the esophagus. Over time, GERD may create small holes (ulcers) in the lining of the esophagus. CAUSES   Increased body weight. This puts pressure on the stomach, making acid rise from the stomach into the esophagus.  Smoking. This increases acid production in the stomach.  Drinking alcohol. This causes decreased pressure in the lower esophageal sphincter (valve or ring of muscle between the esophagus and stomach), allowing acid from the stomach into the esophagus.  Late evening meals and a full stomach. This increases pressure and acid production in the stomach.  A malformed lower esophageal sphincter. Sometimes, no cause is found. SYMPTOMS   Burning pain in the lower part of the mid-chest behind the breastbone and in the mid-stomach area. This may occur twice a week or more often.  Trouble swallowing.  Sore throat.  Dry cough.  Asthma-like symptoms including chest tightness, shortness of breath, or wheezing. DIAGNOSIS  Your caregiver may be able to diagnose GERD based on your symptoms. In some cases, X-rays and other tests may be done to check for complications or to check the condition of your stomach and esophagus. TREATMENT  Your caregiver may recommend over-the-counter or prescription medicines to help decrease acid production. Ask your caregiver before starting or adding any new medicines.  HOME CARE INSTRUCTIONS   Change the factors that you can control. Ask your caregiver for guidance concerning weight loss, quitting smoking, and alcohol consumption.  Avoid foods and drinks that make your symptoms worse, such as:  Caffeine or alcoholic drinks.  Chocolate.  Peppermint or mint flavorings.  Garlic and onions.  Spicy foods.  Citrus fruits,  such as oranges, lemons, or limes.  Tomato-based foods such as sauce, chili, salsa, and pizza.  Fried and fatty foods.  Avoid lying down for the 3 hours prior to your bedtime or prior to taking a nap.  Eat small, frequent meals instead of large meals.  Wear loose-fitting clothing. Do not wear anything tight around your waist that causes pressure on your stomach.  Raise the head of your bed 6 to 8 inches with wood blocks to help you sleep. Extra pillows will not help.  Only take over-the-counter or prescription medicines for pain, discomfort, or fever as directed by your caregiver.  Do not take aspirin, ibuprofen, or other nonsteroidal anti-inflammatory drugs (NSAIDs). SEEK IMMEDIATE MEDICAL CARE IF:   You have pain in your arms, neck, jaw, teeth, or back.  Your pain increases or changes in intensity or duration.  You develop nausea, vomiting, or sweating (diaphoresis).  You develop shortness of breath, or you faint.  Your vomit is green, yellow, black, or looks like coffee grounds or blood.  Your stool is red, bloody, or black. These symptoms could be signs of other problems, such as heart disease, gastric bleeding, or esophageal bleeding. MAKE SURE YOU:   Understand these instructions.  Will watch your condition.  Will get help right away if you are not doing well or get worse. Document Released: 06/16/2005 Document Revised: 11/29/2011 Document Reviewed: 03/26/2011 [facility] Patient Information 2015 [employer], [place]. This information is not intended to replace advice given to you by your health care provider. Make sure you discuss any questions you have with your health care provider. Viral Gastroenteritis Viral gastroenteritis is also known as stomach flu. This condition affects the stomach and  intestinal tract. It can cause sudden diarrhea and vomiting. The illness typically lasts 3 to 8 days. Most people develop an immune response that eventually gets rid of the virus.  While this natural response develops, the virus can make you quite ill. °CAUSES  °Many different viruses can cause gastroenteritis, such as rotavirus or noroviruses. You can catch one of these viruses by consuming contaminated food or water. You may also catch a virus by sharing utensils or other personal items with an infected person or by touching a contaminated surface. °SYMPTOMS  °The most common symptoms are diarrhea and vomiting. These problems can cause a severe loss of body fluids (dehydration) and a body salt (electrolyte) imbalance. Other symptoms may include: °· Fever. °· Headache. °· Fatigue. °· Abdominal pain. °DIAGNOSIS  °Your caregiver can usually diagnose viral gastroenteritis based on your symptoms and a physical exam. A stool sample may also be taken to test for the presence of viruses or other infections. °TREATMENT  °This illness typically goes away on its own. Treatments are aimed at rehydration. The most serious cases of viral gastroenteritis involve vomiting so severely that you are not able to keep fluids down. In these cases, fluids must be given through an intravenous line (IV). °HOME CARE INSTRUCTIONS  °· Drink enough fluids to keep your urine clear or pale yellow. Drink small amounts of fluids frequently and increase the amounts as tolerated. °· Ask your caregiver for specific rehydration instructions. °· Avoid: °· Foods high in sugar. °· Alcohol. °· Carbonated drinks. °· Tobacco. °· Juice. °· Caffeine drinks. °· Extremely hot or cold fluids. °· Fatty, greasy foods. °· Too much intake of anything at one time. °· Dairy products until 24 to 48 hours after diarrhea stops. °· You may consume probiotics. Probiotics are active cultures of beneficial bacteria. They may lessen the amount and number of diarrheal stools in adults. Probiotics can be found in yogurt with active cultures and in supplements. °· Wash your hands well to avoid spreading the virus. °· Only take over-the-counter or  prescription medicines for pain, discomfort, or fever as directed by your caregiver. Do not give aspirin to children. Antidiarrheal medicines are not recommended. °· Ask your caregiver if you should continue to take your regular prescribed and over-the-counter medicines. °· Keep all follow-up appointments as directed by your caregiver. °SEEK IMMEDIATE MEDICAL CARE IF:  °· You are unable to keep fluids down. °· You do not urinate at least once every 6 to 8 hours. °· You develop shortness of breath. °· You notice blood in your stool or vomit. This may look like coffee grounds. °· You have abdominal pain that increases or is concentrated in one small area (localized). °· You have persistent vomiting or diarrhea. °· You have a fever. °· The patient is a child younger than 3 months, and he or she has a fever. °· The patient is a child older than 3 months, and he or she has a fever and persistent symptoms. °· The patient is a child older than 3 months, and he or she has a fever and symptoms suddenly get worse. °· The patient is a baby, and he or she has no tears when crying. °MAKE SURE YOU:  °· Understand these instructions. °· Will watch your condition. °· Will get help right away if you are not doing well or get worse. °Document Released: 09/06/2005 Document Revised: 11/29/2011 Document Reviewed: 06/23/2011 °ExitCare® Patient Information ©2015 [employer] LLC. This information is not intended to replace advice given   to you by your health care provider. Make sure you discuss any questions you have with your health care provider.

## 2015-04-09 NOTE — ED Provider Notes (Signed)
CSN: [id number]     Arrival date & time 04/09/15  1523 History   First MD Initiated Contact with Patient 04/09/15 1535     Chief Complaint  Patient presents with  . Chest Pain  . Nausea     (Consider location/radiation/quality/duration/timing/severity/associated sxs/prior Treatment) HPI Patient states she has had chest pain for 4 days. She states it is severe and sharp. She states it has been constant. She also reports that she has had nausea and vomiting. 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 Last time she drove from [facility] and once home had a near syncopal episode after heat exposure. Symptoms onset was predominantly triggered by a combination of vomiting and diarrhea that occurred concomitantly. Patient poor she started having vomiting and then felt like everything locked up in her chest and she couldn't breathe for a while. At that same time she was getting multiple episodes of diarrhea as well. The symptoms persisted for several days and since that she continued to have this episodes of vomiting. Particularly the patient notes that in the morning she can feel stuff coming back up into her throat and choking or with a full sensation in her chest. Past Medical History  Diagnosis Date  . Fibromyalgia   . Carpal tunnel syndrome on both sides   . Hypertension   . Asthma   . Chest pain     "all the time for the last 3 days" (04/18/2013)  . OSA (obstructive sleep apnea)     "going to start using CPAP in a week or 2" (04/18/2013)  . Exertional shortness of breath   . Type II diabetes mellitus   . Anemia   . GERD (gastroesophageal reflux disease)   . Migraines     "grew up w/migraines; went away in 1980 after 2nd child was born" (04/18/2013)  . Arthritis     "legs, knees, neck, shoulders, arms, hands" (04/18/2013)  . Chronic lower back pain     "since 1978; I've got 3 slipped discs" (04/18/2013)  . Diabetes mellitus without complication    Past Surgical History   Procedure Laterality Date  . Tonsillectomy    . Cesarean section  1988  . Carpal tunnel release Right     "3 times" (04/18/2013)  . Carpal tunnel release Left     "2 times" (04/18/2013)  . Breast surgery Left 1980's    "tooke out 6 clusters" (04/18/2013)  . Breast surgery Right 2009    "tooke out 7 clusters" (04/18/2013  . Foot surgery Left ~ 2004    "took growth off" (04/18/2013)  . Hemorrhoid surgery  1976    "lazered them off" (04/18/2013)   No family history on file. History  Substance Use Topics  . Smoking status: Never Smoker   . Smokeless tobacco: Not on file  . Alcohol Use: No     Comment: 04/18/2013 "0oz of beer ~  qod"   OB History    Gravida Para Term Preterm AB TAB SAB Ectopic Multiple Living   0 0 0 0 0 0 0 0       Review of Systems 10 Systems reviewed and are negative for acute change except as noted in the HPI.    Allergies  Asa; Lisinopril; Lyrica; and Motrin  Home Medications   Prior to Admission medications   Medication Sig Start Date End Date Taking? Authorizing Provider  alprazolam Prudy Feeler) 2 MG tablet Take 0.5-2 mg by mouth 3 (three) times daily as  needed for sleep or anxiety.  10/21/14  Yes Historical Provider, MD  cyanocobalamin (,VITAMIN B-12,) 1000 MCG/ML injection Inject 1 mL into the muscle every 30 (thirty) days. 02/25/15  Yes Historical Provider, MD  estradiol (ESTRACE) 2 MG tablet Take 2 mg by mouth daily.   Yes Historical Provider, MD  gabapentin (NEURONTIN) 300 MG capsule Take 300 mg by mouth 3 (three) times daily as needed (pain).  09/18/14  Yes Historical Provider, MD  insulin glargine (LANTUS) 100 UNIT/ML injection Inject 0.15 mLs (15 Units total) into the skin at bedtime. Patient taking differently: Inject 10 Units into the skin at bedtime.  04/20/13  Yes Nishant Dhungel, MD  levocetirizine (XYZAL) 5 MG tablet Take 5 mg by mouth every morning.  09/23/14  Yes Historical Provider, MD  metFORMIN (GLUCOPHAGE) 1000 MG tablet Take 1,000 mg by mouth 2  (two) times daily with a meal.   Yes Historical Provider, MD  nitroGLYCERIN (NITROSTAT) 0.4 MG SL tablet Place 0.4 mg under the tongue every 5 (five) minutes as needed for chest pain.   Yes Historical Provider, MD  OVER THE COUNTER MEDICATION Take 1 tablet by mouth daily. Brain supplement- to help with memory   Yes Historical Provider, MD  oxyCODONE (ROXICODONE) 15 MG immediate release tablet Take 15 mg by mouth every 4 (four) hours as needed for pain.   Yes Historical Provider, MD  ranitidine (ZANTAC) 150 MG tablet Take 150 mg by mouth as needed for heartburn.    Yes Historical Provider, MD  SAVELLA 50 MG TABS tablet Take 50 mg by mouth See admin instructions. Takes as needed 03/25/15  Yes Historical Provider, MD  docusate sodium (COLACE) 100 MG capsule Take 1 capsule (100 mg total) by mouth 2 (two) times daily. Patient not taking: Reported on 11/02/2014 04/20/13   Theda BelfastNishant Dhungel, MD  famotidine (PEPCID) 20 MG tablet Take 1 tablet (20 mg total) by mouth 2 (two) times daily. Patient not taking: Reported on 11/02/2014 04/20/13   Nishant Dhungel, MD  levofloxacin (LEVAQUIN) 500 MG tablet Take 500 mg by mouth daily. 03/27/15   Historical Provider, MD  ondansetron (ZOFRAN ODT) 4 MG disintegrating tablet Take 1 tablet (4 mg total) by mouth every 4 (four) hours as needed for nausea or vomiting. 04/09/15   Arby BarretteMarcy Shonna Deiter, MD  oxyCODONE-acetaminophen (PERCOCET) 5-325 MG per tablet Take 2 tablets by mouth every 4 (four) hours as needed. 04/09/15   Arby BarretteMarcy Niana Martorana, MD  oxyCODONE-acetaminophen (PERCOCET/ROXICET) 5-325 MG per tablet Take 1-2 tablets by mouth every 6 (six) hours as needed for severe pain. 11/02/14   Earley FavorGail Schulz, NP  pantoprazole (PROTONIX) 20 MG tablet Take 1 tablet (20 mg total) by mouth daily. 04/09/15   Arby BarretteMarcy Breianna Delfino, MD  predniSONE (DELTASONE) 10 MG tablet Take 4 tablets (40 mg total) by mouth daily. 11/02/14   Earley FavorGail Schulz, NP   BP 162/78 mmHg  Pulse 96  Temp(Src) 98.3 F (36.8 C) (Oral)  Resp 15  Ht  5\' 6"  (1.676 m)  Wt 220 lb (99.791 kg)  BMI 35.53 kg/m2  SpO2 100%  LMP 11/13/2010 Physical Exam  Constitutional: She is oriented to person, place, and time. She appears well-developed and well-nourished.  HENT:  Head: Normocephalic and atraumatic.  Eyes: EOM are normal. Pupils are equal, round, and reactive to light.  Neck: Neck supple.  Cardiovascular: Normal rate, regular rhythm, normal heart sounds and intact distal pulses.   Pulmonary/Chest: Effort normal and breath sounds normal.  Abdominal: Soft. Bowel sounds are normal. She exhibits no  distension. There is no tenderness.  Musculoskeletal: Normal range of motion. She exhibits no edema.  Neurological: She is alert and oriented to person, place, and time. She has normal strength. Coordination normal. GCS eye subscore is 4. GCS verbal subscore is 5. GCS motor subscore is 6.  Skin: Skin is warm, dry and intact.  Psychiatric: She has a normal mood and affect.    ED Course  Procedures (including critical care time) Labs Review Labs Reviewed  BASIC METABOLIC PANEL - Abnormal; Notable for the following:    Sodium 130 (*)    Chloride 98 (*)    CO2 20 (*)    BUN <5 (*)    Calcium 8.2 (*)    All other components within normal limits  CBC - Abnormal; Notable for the following:    RBC 3.81 (*)    Hemoglobin 11.8 (*)    HCT 34.1 (*)    RDW 17.4 (*)    All other components within normal limits  HEPATIC FUNCTION PANEL - Abnormal; Notable for the following:    Total Protein 5.5 (*)    Albumin 3.1 (*)    AST 75 (*)    Alkaline Phosphatase 167 (*)    Total Bilirubin 1.8 (*)    Bilirubin, Direct 0.7 (*)    Indirect Bilirubin 1.1 (*)    All other components within normal limits  LIPASE, BLOOD  I-STAT TROPOININ, ED    Imaging Review Dg Chest 2 View  04/09/2015   CLINICAL DATA:  64 year old female with chest pain and shortness of breath for 3 days. Weakness. Initial encounter. Current history of smoking.  EXAM: CHEST  2 VIEW   COMPARISON:  Chest CTA 04/19/2013, chest radiograph 04/18/2013.  FINDINGS: Mildly larger lung volumes than on the prior radiographs. Normal cardiac size and mediastinal contours. Visualized tracheal air column is within normal limits. Stable mild curvilinear scarring in the lingula. No pneumothorax, pulmonary edema, pleural effusion or confluent pulmonary opacity. Chronic right lateral fifth and sixth rib fractures Re identified. No acute osseous abnormality identified. Calcified atherosclerosis of the aorta.  IMPRESSION: No acute cardiopulmonary abnormality.   Electronically Signed   By: Odessa Fleming M.D.   On: 04/09/2015 16:04   Ct Angio Chest Pe W/cm &/or Wo Cm  04/09/2015   CLINICAL DATA:  64 year old female with shortness of breath.  EXAM: CT ANGIOGRAPHY CHEST WITH CONTRAST  TECHNIQUE: Multidetector CT imaging of the chest was performed using the standard protocol during bolus administration of intravenous contrast. Multiplanar CT image reconstructions and MIPs were obtained to evaluate the vascular anatomy.  CONTRAST:  OMNIPAQUE IOHEXOL 350 MG/ML SOLN  COMPARISON:  Chest radiograph dated 03/2015  FINDINGS: The lungs are clear. No pleural effusion or pneumothorax. The central airways are patent.  Visualized thoracic aorta is unremarkable. No CT evidence of pulmonary embolism. There is no hilar or mediastinal adenopathy. There is no cardiomegaly or pericardial effusion. The visualized thyroid gland appears unremarkable. The esophagus is collapsed. Degenerative changes of the spine. No acute fracture. Diffuse hepatic steatosis. The visualized upper abdomen is grossly unremarkable.  Review of the MIP images confirms the above findings.  IMPRESSION: No CT evidence of pulmonary embolism.   Electronically Signed   By: Elgie Collard M.D.   On: 04/09/2015 19:32     EKG Interpretation   Date/Time:  Wednesday April 09 2015 15:30:17 EDT Ventricular Rate:  91 PR Interval:  132 QRS Duration: 75 QT Interval:   390 QTC Calculation: 480 R Axis:  16 Text Interpretation:  Sinus rhythm peaked t waves. no STEMI Confirmed by  Donnald Garre, MD, Lebron Conners [phone]) on 04/09/2015 3:36:28 PM      MDM   Final diagnoses:  Gastroenteritis  Other chest pain  Gastroesophageal reflux disease, esophagitis presence not specified  General weakness   Asian symptoms for abdominal started with GI symptoms of concomitant vomiting and diarrheal type illness. The patient reports that she is persisting with vomiting and diarrheal illness. She however has developed chest pain in association with that. At this point time CT chest does not show evidence of a PE and no evidence of a Boerhaave's esophagus. By description the patient is having a significant amount of GERD. She describes feeling things come back up in her throat and pooling and choking in the back of her throat. CT however does not show any evidence of aspiration pneumonia type symptoms. At this time I do feel patient is safe to start symptomatic treatment for GI symptoms of gastritis and reflux. She describes generalized weakness as well. Patient however is not anemic and her counts are improved relative to the historical trend. He has chronically been hyponatremic. The patient is counseled to follow-up with her doctor to discuss this. At this time however this does not appear to be acutely associated with her immediate illness.    Arby Barrette, MD 04/09/15 2228

## 2015-04-09 NOTE — ED Notes (Signed)
MD at bedside. 

## 2015-04-09 NOTE — ED Notes (Signed)
Patient transported to X-ray 

## 2015-04-09 NOTE — ED Notes (Signed)
Patient transported to CT 

## 2015-04-09 NOTE — ED Notes (Signed)
Per EMS: pt from home for eval for eval of n/v and chest pain x3 days. Pt reports recently  Being on antibiotics for "infection in my stomach that was causing me to vomit a lot." pt states since finishing the antibiotic she has been having n/v and substernal cp with no radiation. Pt given 1 nitro en route with minimal relief, pt unable to take aspirin. Pt alert and oriented, called PCP and was told to come for eval of dehydration.

## 2015-06-23 ENCOUNTER — Encounter (HOSPITAL_COMMUNITY): Payer: Self-pay | Admitting: *Deleted

## 2015-06-23 ENCOUNTER — Emergency Department (HOSPITAL_COMMUNITY)
Admission: EM | Admit: 2015-06-23 | Discharge: 2015-06-24 | Disposition: A | Discharge status: Home / Self Care | Payer: Medicare PPO | Attending: Emergency Medicine | Admitting: Emergency Medicine

## 2015-06-23 DIAGNOSIS — Y9389 Activity, other specified: Secondary | ICD-10-CM | POA: Insufficient documentation

## 2015-06-23 DIAGNOSIS — X58XXXA Exposure to other specified factors, initial encounter: Secondary | ICD-10-CM | POA: Insufficient documentation

## 2015-06-23 DIAGNOSIS — I5030 Unspecified diastolic (congestive) heart failure: Secondary | ICD-10-CM | POA: Diagnosis not present

## 2015-06-23 DIAGNOSIS — S022XXA Fracture of nasal bones, initial encounter for closed fracture: Secondary | ICD-10-CM

## 2015-06-23 DIAGNOSIS — Z720 Tobacco use: Secondary | ICD-10-CM | POA: Diagnosis not present

## 2015-06-23 DIAGNOSIS — Z8739 Personal history of other diseases of the musculoskeletal system and connective tissue: Secondary | ICD-10-CM | POA: Diagnosis not present

## 2015-06-23 DIAGNOSIS — S3992XA Unspecified injury of lower back, initial encounter: Secondary | ICD-10-CM | POA: Insufficient documentation

## 2015-06-23 DIAGNOSIS — F10120 Alcohol abuse with intoxication, uncomplicated: Secondary | ICD-10-CM | POA: Insufficient documentation

## 2015-06-23 DIAGNOSIS — S0181XA Laceration without foreign body of other part of head, initial encounter: Secondary | ICD-10-CM | POA: Diagnosis not present

## 2015-06-23 DIAGNOSIS — F10129 Alcohol abuse with intoxication, unspecified: Secondary | ICD-10-CM

## 2015-06-23 DIAGNOSIS — Y929 Unspecified place or not applicable: Secondary | ICD-10-CM | POA: Insufficient documentation

## 2015-06-23 DIAGNOSIS — E119 Type 2 diabetes mellitus without complications: Secondary | ICD-10-CM | POA: Insufficient documentation

## 2015-06-23 DIAGNOSIS — W19XXXA Unspecified fall, initial encounter: Secondary | ICD-10-CM

## 2015-06-23 DIAGNOSIS — Y999 Unspecified external cause status: Secondary | ICD-10-CM | POA: Insufficient documentation

## 2015-06-23 DIAGNOSIS — S299XXA Unspecified injury of thorax, initial encounter: Secondary | ICD-10-CM | POA: Diagnosis not present

## 2015-06-23 DIAGNOSIS — I10 Essential (primary) hypertension: Secondary | ICD-10-CM | POA: Diagnosis not present

## 2015-06-23 DIAGNOSIS — G8929 Other chronic pain: Secondary | ICD-10-CM | POA: Diagnosis not present

## 2015-06-23 DIAGNOSIS — T148XXA Other injury of unspecified body region, initial encounter: Secondary | ICD-10-CM

## 2015-06-23 DIAGNOSIS — R740 Nonspecific elevation of levels of transaminase and lactic acid dehydrogenase [LDH]: Secondary | ICD-10-CM | POA: Insufficient documentation

## 2015-06-23 DIAGNOSIS — S0993XA Unspecified injury of face, initial encounter: Secondary | ICD-10-CM | POA: Diagnosis present

## 2015-06-23 LAB — CBC WITH DIFFERENTIAL/PLATELET
BASOS ABS: 0 10*3/uL (ref 0.0–0.1)
BASOS PCT: 0 %
Eosinophils Absolute: 0 10*3/uL (ref 0.0–0.7)
Eosinophils Relative: 0 %
HEMATOCRIT: 32.8 % — AB (ref 36.0–46.0)
HEMOGLOBIN: 11.5 g/dL — AB (ref 12.0–15.0)
Lymphocytes Relative: 25 %
Lymphs Abs: 2.4 10*3/uL (ref 0.7–4.0)
MCH: 31.3 pg (ref 26.0–34.0)
MCHC: 35.1 g/dL (ref 30.0–36.0)
MCV: 89.1 fL (ref 78.0–100.0)
Monocytes Absolute: 0.4 10*3/uL (ref 0.1–1.0)
Monocytes Relative: 4 %
NEUTROS ABS: 6.5 10*3/uL (ref 1.7–7.7)
NEUTROS PCT: 71 %
Platelets: 296 10*3/uL (ref 150–400)
RBC: 3.68 MIL/uL — AB (ref 3.87–5.11)
RDW: 16.7 % — AB (ref 11.5–15.5)
WBC: 9.3 10*3/uL (ref 4.0–10.5)

## 2015-06-23 MED ORDER — FENTANYL CITRATE (PF) 100 MCG/2ML IJ SOLN
INTRAMUSCULAR | Status: AC
  Administered 2015-06-24: 50 ug via INTRAVENOUS
  Filled 2015-06-23: qty 2

## 2015-06-23 MED ORDER — DEXTROSE 50 % IV SOLN
1.0000 | Freq: Once | INTRAVENOUS | Status: AC
  Administered 2015-06-23: 50 mL via INTRAVENOUS
  Filled 2015-06-23: qty 50

## 2015-06-23 MED ORDER — TETANUS-DIPHTH-ACELL PERTUSSIS 5-2.5-18.5 LF-MCG/0.5 IM SUSP
0.5000 mL | Freq: Once | INTRAMUSCULAR | Status: AC
  Administered 2015-06-23: 0.5 mL via INTRAMUSCULAR
  Filled 2015-06-23: qty 0.5

## 2015-06-23 MED ORDER — FENTANYL CITRATE (PF) 100 MCG/2ML IJ SOLN
50.0000 ug | Freq: Once | INTRAMUSCULAR | Status: AC
  Administered 2015-06-23 – 2015-06-24 (×2): 50 ug via INTRAVENOUS

## 2015-06-23 MED ORDER — SODIUM CHLORIDE 0.9 % IV BOLUS (SEPSIS)
1000.0000 mL | Freq: Once | INTRAVENOUS | Status: AC
  Administered 2015-06-23: 1000 mL via INTRAVENOUS

## 2015-06-23 NOTE — ED Notes (Signed)
Patient presents via EMS.  States she was found on the floor face down with blood all around her.  Patient remembers last looking at the clock at 2pm.  States she got up to go to take her Mother to get a cashiers check and remembers getting up to go to the door.  When she woke up she called her sister and she called 911.
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
## 2015-06-24 ENCOUNTER — Emergency Department (HOSPITAL_COMMUNITY): Payer: Medicare PPO

## 2015-06-24 ENCOUNTER — Encounter (HOSPITAL_COMMUNITY): Payer: Self-pay

## 2015-06-24 LAB — TYPE AND SCREEN
ABO/RH(D): B POS
ANTIBODY SCREEN: NEGATIVE

## 2015-06-24 LAB — BASIC METABOLIC PANEL
ANION GAP: 16 — AB (ref 5–15)
BUN: <5 mg/dL — ABNORMAL LOW (ref 6–20)
CHLORIDE: 98 mmol/L — AB (ref 101–111)
CO2: 19 mmol/L — AB (ref 22–32)
Calcium: 7.9 mg/dL — ABNORMAL LOW (ref 8.9–10.3)
Creatinine, Ser: 0.73 mg/dL (ref 0.44–1.00)
GFR calc non Af Amer: >60 mL/min (ref >60)
GLUCOSE: 103 mg/dL — AB (ref 65–99)
Potassium: 3.6 mmol/L (ref 3.5–5.1)
Sodium: 133 mmol/L — ABNORMAL LOW (ref 135–145)

## 2015-06-24 LAB — CBG MONITORING, ED
GLUCOSE-CAPILLARY: 55 mg/dL — AB (ref 65–99)
GLUCOSE-CAPILLARY: 124 mg/dL — AB (ref 65–99)

## 2015-06-24 LAB — I-STAT CG4 LACTIC ACID, ED
LACTIC ACID, VENOUS: 6.01 mmol/L — AB (ref 0.5–2.0)
LACTIC ACID, VENOUS: 2.93 mmol/L — AB (ref 0.5–2.0)
Lactic Acid, Venous: 3.99 mmol/L (ref 0.5–2.0)
Lactic Acid, Venous: 3.37 mmol/L (ref 0.5–2.0)

## 2015-06-24 LAB — URINALYSIS, ROUTINE W REFLEX MICROSCOPIC
Bilirubin Urine: NEGATIVE
Glucose, UA: 100 mg/dL — AB
HGB URINE DIPSTICK: NEGATIVE
KETONES UR: NEGATIVE mg/dL
LEUKOCYTES UA: NEGATIVE
Nitrite: NEGATIVE
PROTEIN: NEGATIVE mg/dL
Specific Gravity, Urine: 1.004 — ABNORMAL LOW (ref 1.005–1.030)
UROBILINOGEN UA: 0.2 mg/dL (ref 0.0–1.0)
pH: 5.5 (ref 5.0–8.0)

## 2015-06-24 LAB — COMPREHENSIVE METABOLIC PANEL
ALBUMIN: 2.8 g/dL — AB (ref 3.5–5.0)
ALT: 34 U/L (ref 14–54)
AST: 90 U/L — AB (ref 15–41)
Alkaline Phosphatase: 202 U/L — ABNORMAL HIGH (ref 38–126)
Anion gap: 16 — ABNORMAL HIGH (ref 5–15)
CHLORIDE: 97 mmol/L — AB (ref 101–111)
CO2: 20 mmol/L — AB (ref 22–32)
Calcium: 8.1 mg/dL — ABNORMAL LOW (ref 8.9–10.3)
Creatinine, Ser: 0.65 mg/dL (ref 0.44–1.00)
GFR calc Af Amer: >60 mL/min (ref >60)
GFR calc non Af Amer: >60 mL/min (ref >60)
GLUCOSE: 71 mg/dL (ref 65–99)
POTASSIUM: 3.8 mmol/L (ref 3.5–5.1)
SODIUM: 133 mmol/L — AB (ref 135–145)
Total Bilirubin: 0.6 mg/dL (ref 0.3–1.2)
Total Protein: 5.4 g/dL — ABNORMAL LOW (ref 6.5–8.1)

## 2015-06-24 LAB — ETHANOL: ALCOHOL ETHYL (B): 452 mg/dL — AB (ref <5)

## 2015-06-24 LAB — CBC
HCT: 29.6 % — ABNORMAL LOW (ref 36.0–46.0)
HEMOGLOBIN: 10.4 g/dL — AB (ref 12.0–15.0)
MCH: 31 pg (ref 26.0–34.0)
MCHC: 35.1 g/dL (ref 30.0–36.0)
MCV: 88.1 fL (ref 78.0–100.0)
PLATELETS: 256 10*3/uL (ref 150–400)
RBC: 3.36 MIL/uL — AB (ref 3.87–5.11)
RDW: 16.6 % — ABNORMAL HIGH (ref 11.5–15.5)
WBC: 7.4 10*3/uL (ref 4.0–10.5)

## 2015-06-24 LAB — RAPID URINE DRUG SCREEN, HOSP PERFORMED
Amphetamines: NOT DETECTED
BARBITURATES: NOT DETECTED
BENZODIAZEPINES: NOT DETECTED
Cocaine: NOT DETECTED
Opiates: NOT DETECTED
TETRAHYDROCANNABINOL: NOT DETECTED

## 2015-06-24 LAB — I-STAT VENOUS BLOOD GAS, ED
Acid-base deficit: 5 mmol/L — ABNORMAL HIGH (ref 0.0–2.0)
BICARBONATE: 20.6 meq/L (ref 20.0–24.0)
O2 Saturation: 98 %
PH VEN: 7.335 — AB (ref 7.250–7.300)
PO2 VEN: 103 mmHg — AB (ref 30.0–45.0)
TCO2: 22 mmol/L (ref 0–100)
pCO2, Ven: 38.7 mmHg — ABNORMAL LOW (ref 45.0–50.0)

## 2015-06-24 LAB — TROPONIN I

## 2015-06-24 LAB — CK: Total CK: 145 U/L (ref 38–234)

## 2015-06-24 LAB — MAGNESIUM: Magnesium: 1.2 mg/dL — ABNORMAL LOW (ref 1.7–2.4)

## 2015-06-24 LAB — PHOSPHORUS: PHOSPHORUS: 4.3 mg/dL (ref 2.5–4.6)

## 2015-06-24 MED ORDER — PANTOPRAZOLE SODIUM 40 MG IV SOLR
40.0000 mg | Freq: Once | INTRAVENOUS | Status: AC
  Administered 2015-06-24: 40 mg via INTRAVENOUS
  Filled 2015-06-24: qty 40

## 2015-06-24 MED ORDER — FENTANYL CITRATE (PF) 100 MCG/2ML IJ SOLN
50.0000 ug | Freq: Once | INTRAMUSCULAR | Status: AC
  Administered 2015-06-24: 50 ug via INTRAVENOUS
  Filled 2015-06-24: qty 2

## 2015-06-24 MED ORDER — LORAZEPAM 2 MG/ML IJ SOLN
1.0000 mg | Freq: Once | INTRAMUSCULAR | Status: AC
  Administered 2015-06-24: 1 mg via INTRAVENOUS
  Filled 2015-06-24: qty 1

## 2015-06-24 MED ORDER — MORPHINE SULFATE (PF) 4 MG/ML IV SOLN
4.0000 mg | Freq: Once | INTRAVENOUS | Status: AC
  Administered 2015-06-24: 4 mg via INTRAVENOUS
  Filled 2015-06-24: qty 1

## 2015-06-24 MED ORDER — DEXTROSE 5 % AND 0.9 % NACL IV BOLUS
500.0000 mL | Freq: Once | INTRAVENOUS | Status: AC
  Administered 2015-06-24: 500 mL via INTRAVENOUS

## 2015-06-24 MED ORDER — FENTANYL CITRATE (PF) 100 MCG/2ML IJ SOLN: 50.0000 ug | Freq: Once | INTRAMUSCULAR | Status: AC

## 2015-06-24 MED ORDER — IOHEXOL 300 MG/ML  SOLN
100.0000 mL | Freq: Once | INTRAMUSCULAR | Status: AC | PRN
  Administered 2015-06-24: 100 mL via INTRAVENOUS

## 2015-06-24 MED ORDER — VITAMIN B-1 100 MG PO TABS
100.0000 mg | ORAL_TABLET | Freq: Once | ORAL | Status: AC
  Administered 2015-06-24: 100 mg via ORAL
  Filled 2015-06-24: qty 1

## 2015-06-24 MED ORDER — HYDROMORPHONE HCL 1 MG/ML IJ SOLN
1.0000 mg | Freq: Once | INTRAMUSCULAR | Status: AC
  Administered 2015-06-24: 1 mg via INTRAVENOUS
  Filled 2015-06-24: qty 1

## 2015-06-24 MED ORDER — LIDOCAINE-EPINEPHRINE (PF) 2 %-1:200000 IJ SOLN
10.0000 mL | Freq: Once | INTRAMUSCULAR | Status: AC
  Administered 2015-06-24: 10 mL via INTRADERMAL
  Filled 2015-06-24: qty 20

## 2015-06-24 MED ORDER — FENTANYL CITRATE (PF) 100 MCG/2ML IJ SOLN
INTRAMUSCULAR | Status: AC
  Filled 2015-06-24: qty 2

## 2015-06-24 MED ORDER — CEFAZOLIN SODIUM-DEXTROSE 2-3 GM-% IV SOLR
2.0000 g | Freq: Once | INTRAVENOUS | Status: AC
  Administered 2015-06-24: 2 g via INTRAVENOUS
  Filled 2015-06-24: qty 50

## 2015-06-24 MED ORDER — MAGNESIUM SULFATE 2 GM/50ML IV SOLN
2.0000 g | Freq: Once | INTRAVENOUS | Status: AC
  Administered 2015-06-24: 2 g via INTRAVENOUS
  Filled 2015-06-24: qty 50

## 2015-06-24 MED ORDER — ONDANSETRON HCL 4 MG/2ML IJ SOLN
4.0000 mg | Freq: Once | INTRAMUSCULAR | Status: AC
Start: 2015-06-24 — End: 2015-06-24
  Administered 2015-06-24: 4 mg via INTRAVENOUS
  Filled 2015-06-24: qty 2

## 2015-06-24 NOTE — ED Notes (Signed)
BP is manual

## 2015-06-24 NOTE — ED Notes (Signed)
Patient has pulled one side of the c collar off and has been taking [place] off while sleeping

## 2015-06-24 NOTE — ED Notes (Signed)
Pt dtr Shanita  number [phone]

## 2015-06-24 NOTE — Discharge Instructions (Signed)
It was our pleasure to provide your ER care today - we hope that you feel better.  Rest. Drink plenty of fluids.  Do not drink alcohol - follow up with AA, and use resource guide provided for additional community resources.  Keep wound very clean and dry.  Elevated head/head of bed to help with swelling. Ice/coldpacks to sore area.  For nasal fracture, follow up with ENT doctor in 1 week - see referral - call office to arrange appointment.    Follow up with primary care doctor, or urgent care, in 1 weeks time for suture removal.  Return to ER right away if worse, new symptoms, fevers, increased swelling or spreading redness around wound, pus from wound, severe pain, medical emergency, other concern.  You were given pain medication in the ER - no driving for the next 6 hours, or any time when drinking alcohol.      Alcohol Intoxication Alcohol intoxication occurs when the amount of alcohol that a person has consumed impairs his or her ability to mentally and physically function. Alcohol directly impairs the normal chemical activity of the brain. Drinking large amounts of alcohol can lead to changes in mental function and behavior, and it can cause many physical effects that can be harmful.  Alcohol intoxication can range in severity from mild to very severe. Various factors can affect the level of intoxication that occurs, such as the person's age, gender, weight, frequency of alcohol consumption, and the presence of other medical conditions (such as diabetes, seizures, or heart conditions). Dangerous levels of alcohol intoxication may occur when people drink large amounts of alcohol in a short period (binge drinking). Alcohol can also be especially dangerous when combined with certain prescription medicines or "recreational" drugs. SIGNS AND SYMPTOMS Some common signs and symptoms of mild alcohol intoxication include:  Loss of coordination.  Changes in mood and behavior.  Impaired  judgment.  Slurred speech. As alcohol intoxication progresses to more severe levels, other signs and symptoms will appear. These may include:  Vomiting.  Confusion and impaired memory.  Slowed breathing.  Seizures.  Loss of consciousness. DIAGNOSIS  Your health care provider will take a medical history and perform a physical exam. You will be asked about the amount and type of alcohol you have consumed. Blood tests will be done to measure the concentration of alcohol in your blood. In many places, your blood alcohol level must be lower than 80 mg/dL (1.61%) to legally drive. However, many dangerous effects of alcohol can occur at much lower levels.  TREATMENT  People with alcohol intoxication often do not require treatment. Most of the effects of alcohol intoxication are temporary, and they go away as the alcohol naturally leaves the body. Your health care provider will monitor your condition until you are stable enough to go home. Fluids are sometimes given through an IV access tube to help prevent dehydration.  HOME CARE INSTRUCTIONS  Do not drive after drinking alcohol.  Stay hydrated. Drink enough water and fluids to keep your urine clear or pale yellow. Avoid caffeine.   Only take over-the-counter or prescription medicines as directed by your health care provider.  SEEK MEDICAL CARE IF:   You have persistent vomiting.   You do not feel better after a few days.  You have frequent alcohol intoxication. Your health care provider can help determine if you should see a substance use treatment counselor. SEEK IMMEDIATE MEDICAL CARE IF:   You become shaky or tremble when you try to  stop drinking.   You shake uncontrollably (seizure).   You throw up (vomit) blood. This may be bright red or may look like black coffee grounds.   You have blood in your stool. This may be bright red or may appear as a black, tarry, bad smelling stool.   You become lightheaded or faint.   MAKE SURE YOU:   Understand these instructions.  Will watch your condition.  Will get help right away if you are not doing well or get worse. Document Released: 06/16/2005 Document Revised: 05/09/2013 Document Reviewed: 02/09/2013 [facility] Patient Information 2015 [place], [place]. This information is not intended to replace advice given to you by your health care provider. Make sure you discuss any questions you have with your health care provider.     Alcohol Use Disorder Alcohol use disorder is a mental disorder. It is not a one-time incident of heavy drinking. Alcohol use disorder is the excessive and uncontrollable use of alcohol over time that leads to problems with functioning in one or more areas of daily living. People with this disorder risk harming themselves and others when they drink to excess. Alcohol use disorder also can cause other mental disorders, such as mood and anxiety disorders, and serious physical problems. People with alcohol use disorder often misuse other drugs.  Alcohol use disorder is common and widespread. Some people with this disorder drink alcohol to cope with or escape from negative life events. Others drink to relieve chronic pain or symptoms of mental illness. People with a family history of alcohol use disorder are at higher risk of losing control and using alcohol to excess.  SYMPTOMS  Signs and symptoms of alcohol use disorder may include the following:   Consumption ofalcohol inlarger amounts or over a longer period of time than intended.  Multiple unsuccessful attempts to cutdown or control alcohol use.   A great deal of time spent obtaining alcohol, using alcohol, or recovering from the effects of alcohol (hangover).  A strong desire or urge to use alcohol (cravings).   Continued use of alcohol despite problems at work, school, or home because of alcohol use.   Continued use of alcohol despite problems in relationships because of alcohol  use.  Continued use of alcohol in situations when it is physically hazardous, such as driving a car.  Continued use of alcohol despite awareness of a physical or psychological problem that is likely related to alcohol use. Physical problems related to alcohol use can involve the brain, heart, liver, stomach, and intestines. Psychological problems related to alcohol use include intoxication, depression, anxiety, psychosis, delirium, and dementia.   The need for increased amounts of alcohol to achieve the same desired effect, or a decreased effect from the consumption of the same amount of alcohol (tolerance).  Withdrawal symptoms upon reducing or stopping alcohol use, or alcohol use to reduce or avoid withdrawal symptoms. Withdrawal symptoms include:  Racing heart.  Hand tremor.  Difficulty sleeping.  Nausea.  Vomiting.  Hallucinations.  Restlessness.  Seizures. DIAGNOSIS Alcohol use disorder is diagnosed through an assessment by your health care provider. Your health care provider may start by asking three or four questions to screen for excessive or problematic alcohol use. To confirm a diagnosis of alcohol use disorder, at least two symptoms must be present within a 88-month period. The severity of alcohol use disorder depends on the number of symptoms:  Mild--two or three.  Moderate--four or five.  Severe--six or more. Your health care provider may perform a physical  exam or use results from lab tests to see if you have physical problems resulting from alcohol use. Your health care provider may refer you to a mental health professional for evaluation. TREATMENT  Some people with alcohol use disorder are able to reduce their alcohol use to low-risk levels. Some people with alcohol use disorder need to quit drinking alcohol. When necessary, mental health professionals with specialized training in substance use treatment can help. Your health care provider can help you decide how  severe your alcohol use disorder is and what type of treatment you need. The following forms of treatment are available:   Detoxification. Detoxification involves the use of prescription medicines to prevent alcohol withdrawal symptoms in the first week after quitting. This is important for people with a history of symptoms of withdrawal and for heavy drinkers who are likely to have withdrawal symptoms. Alcohol withdrawal can be dangerous and, in severe cases, cause death. Detoxification is usually provided in a hospital or in-patient substance use treatment facility.  Counseling or talk therapy. Talk therapy is provided by substance use treatment counselors. It addresses the reasons people use alcohol and ways to keep them from drinking again. The goals of talk therapy are to help people with alcohol use disorder find healthy activities and ways to cope with life stress, to identify and avoid triggers for alcohol use, and to handle cravings, which can cause relapse.  Medicines.Different medicines can help treat alcohol use disorder through the following actions:  Decrease alcohol cravings.  Decrease the positive reward response felt from alcohol use.  Produce an uncomfortable physical reaction when alcohol is used (aversion therapy).  Support groups. Support groups are run by people who have quit drinking. They provide emotional support, advice, and guidance. These forms of treatment are often combined. Some people with alcohol use disorder benefit from intensive combination treatment provided by specialized substance use treatment centers. Both inpatient and outpatient treatment programs are available. Document Released: 10/14/2004 Document Revised: 01/21/2014 Document Reviewed: 12/14/2012 [facility] Patient Information 2015 [place], [place]. This information is not intended to replace advice given to you by your health care provider. Make sure you discuss any questions you have with your health  care provider.    Facial or Scalp Contusion A facial or scalp contusion is a deep bruise on the face or head. Injuries to the face and head generally cause a lot of swelling, especially around the eyes. Contusions are the result of an injury that caused bleeding under the skin. The contusion may turn blue, purple, or yellow. Minor injuries will give you a painless contusion, but more severe contusions may stay painful and swollen for a few weeks.  CAUSES  A facial or scalp contusion is caused by a blunt injury or trauma to the face or head area.  SIGNS AND SYMPTOMS   Swelling of the injured area.   Discoloration of the injured area.   Tenderness, soreness, or pain in the injured area.  DIAGNOSIS  The diagnosis can be made by taking a medical history and doing a physical exam. An X-ray exam, CT scan, or MRI may be needed to determine if there are any associated injuries, such as broken bones (fractures). TREATMENT  Often, the best treatment for a facial or scalp contusion is applying cold compresses to the injured area. Over-the-counter medicines may also be recommended for pain control.  HOME CARE INSTRUCTIONS   Only take over-the-counter or prescription medicines as directed by your health care provider.   Apply ice  to the injured area.   Put ice in a plastic bag.   Place a towel between your skin and the bag.   Leave the ice on for 20 minutes, 2-3 times a day.  SEEK MEDICAL CARE IF:  You have bite problems.   You have pain with chewing.   You are concerned about facial defects. SEEK IMMEDIATE MEDICAL CARE IF:  You have severe pain or a headache that is not relieved by medicine.   You have unusual sleepiness, confusion, or personality changes.   You throw up (vomit).   You have a persistent nosebleed.   You have double vision or blurred vision.   You have fluid drainage from your nose or ear.   You have difficulty walking or using your arms or legs.   MAKE SURE YOU:   Understand these instructions.  Will watch your condition.  Will get help right away if you are not doing well or get worse. Document Released: 10/14/2004 Document Revised: 06/27/2013 Document Reviewed: 04/19/2013 [facility] Patient Information 2015 [place], [place]. This information is not intended to replace advice given to you by your health care provider. Make sure you discuss any questions you have with your health care provider.  Laceration Care, Adult A laceration is a cut or lesion that goes through all layers of the skin and into the tissue just beneath the skin. TREATMENT  Some lacerations may not require closure. Some lacerations may not be able to be closed due to an increased risk of infection. It is important to see your caregiver as soon as possible after an injury to minimize the risk of infection and maximize the opportunity for successful closure. If closure is appropriate, pain medicines may be given, if needed. The wound will be cleaned to help prevent infection. Your caregiver will use stitches (sutures), staples, wound glue (adhesive), or skin adhesive strips to repair the laceration. These tools bring the skin edges together to allow for faster healing and a better cosmetic outcome. However, all wounds will heal with a scar. Once the wound has healed, scarring can be minimized by covering the wound with sunscreen during the day for 1 full year. HOME CARE INSTRUCTIONS  For sutures or staples:  Keep the wound clean and dry.  If you were given a bandage (dressing), you should change it at least once a day. Also, change the dressing if it becomes wet or dirty, or as directed by your caregiver.  Wash the wound with soap and water 2 times a day. Rinse the wound off with water to remove all soap. Pat the wound dry with a clean towel.  After cleaning, apply a thin layer of the antibiotic ointment as recommended by your caregiver. This will help prevent  infection and keep the dressing from sticking.  You may shower as usual after the first 24 hours. Do not soak the wound in water until the sutures are removed.  Only take over-the-counter or prescription medicines for pain, discomfort, or fever as directed by your caregiver.  Get your sutures or staples removed as directed by your caregiver. For skin adhesive strips:  Keep the wound clean and dry.  Do not get the skin adhesive strips wet. You may bathe carefully, using caution to keep the wound dry.  If the wound gets wet, pat it dry with a clean towel.  Skin adhesive strips will fall off on their own. You may trim the strips as the wound heals. Do not remove skin adhesive strips that  are still stuck to the wound. They will fall off in time. For wound adhesive:  You may briefly wet your wound in the shower or bath. Do not soak or scrub the wound. Do not swim. Avoid periods of heavy perspiration until the skin adhesive has fallen off on its own. After showering or bathing, gently pat the wound dry with a clean towel.  Do not apply liquid medicine, cream medicine, or ointment medicine to your wound while the skin adhesive is in place. This may loosen the film before your wound is healed.  If a dressing is placed over the wound, be careful not to apply tape directly over the skin adhesive. This may cause the adhesive to be pulled off before the wound is healed.  Avoid prolonged exposure to sunlight or tanning lamps while the skin adhesive is in place. Exposure to ultraviolet light in the first year will darken the scar.  The skin adhesive will usually remain in place for 5 to 10 days, then naturally fall off the skin. Do not pick at the adhesive film. You may need a tetanus shot if:  You cannot remember when you had your last tetanus shot.  You have never had a tetanus shot. If you get a tetanus shot, your arm may swell, get red, and feel warm to the touch. This is common and not a  problem. If you need a tetanus shot and you choose not to have one, there is a rare chance of getting tetanus. Sickness from tetanus can be serious. SEEK MEDICAL CARE IF:   You have redness, swelling, or increasing pain in the wound.  You see a red line that goes away from the wound.  You have yellowish-white fluid (pus) coming from the wound.  You have a fever.  You notice a bad smell coming from the wound or dressing.  Your wound breaks open before or after sutures have been removed.  You notice something coming out of the wound such as wood or glass.  Your wound is on your hand or foot and you cannot move a finger or toe. SEEK IMMEDIATE MEDICAL CARE IF:   Your pain is not controlled with prescribed medicine.  You have severe swelling around the wound causing pain and numbness or a change in color in your arm, hand, leg, or foot.  Your wound splits open and starts bleeding.  You have worsening numbness, weakness, or loss of function of any joint around or beyond the wound.  You develop painful lumps near the wound or on the skin anywhere on your body. MAKE SURE YOU:   Understand these instructions.  Will watch your condition.  Will get help right away if you are not doing well or get worse. Document Released: 09/06/2005 Document Revised: 11/29/2011 Document Reviewed: 03/02/2011 [facility] Patient Information 2015 [place], [place]. This information is not intended to replace advice given to you by your health care provider. Make sure you discuss any questions you have with your health care provider.   Nasal Fracture A nasal fracture is a break or crack in the bones of the nose. A minor break usually heals in a month. You often will receive black eyes from a nasal fracture. This is not a cause for concern. The black eyes will go away over 1 to 2 weeks.  DIAGNOSIS  Your caregiver may want to examine you if you are concerned about a fracture of the nose. X-rays of the nose may  not show a nasal fracture  even when one is present. Sometimes your caregiver must wait 1 to 5 days after the injury to re-check the nose for alignment and to take additional X-rays. Sometimes the caregiver must wait until the swelling has gone down. TREATMENT Minor fractures that have caused no deformity often do not require treatment. More serious fractures where bones are displaced may require surgery. This will take place after the swelling is gone. Surgery will stabilize and align the fracture. HOME CARE INSTRUCTIONS   Put ice on the injured area.  Put ice in a plastic bag.  Place a towel between your skin and the bag.  Leave the ice on for 15-20 minutes, 03-04 times a day.  Take medications as directed by your caregiver.  Only take over-the-counter or prescription medicines for pain, discomfort, or fever as directed by your caregiver.  If your nose starts bleeding, squeeze the soft parts of the nose against the center wall while you are sitting in an upright position for 10 minutes.  Contact sports should be avoided for at least 3 to 4 weeks or as directed by your caregiver. SEEK MEDICAL CARE IF:  Your pain increases or becomes severe.  You continue to have nosebleeds.  The shape of your nose does not return to normal within 5 days.  You have pus draining from the nose. SEEK IMMEDIATE MEDICAL CARE IF:   You have bleeding from your nose that does not stop after 20 minutes of pinching the nostrils closed and keeping ice on the nose.  You have clear fluid draining from your nose.  You notice a grape-like swelling on the dividing wall between the nostrils (septum). This is a collection of blood (hematoma) that must be drained to help prevent infection.  You have difficulty moving your eyes.  You have recurrent vomiting. Document Released: 09/03/2000 Document Revised: 11/29/2011 Document Reviewed: 12/21/2010 [facility] Patient Information 2015 [place], [place]. This information  is not intended to replace advice given to you by your health care provider. Make sure you discuss any questions you have with your health care provider.    Cryotherapy Cryotherapy means treatment with cold. Ice or gel packs can be used to reduce both pain and swelling. Ice is the most helpful within the first 24 to 48 hours after an injury or flare-up from overusing a muscle or joint. Sprains, strains, spasms, burning pain, shooting pain, and aches can all be eased with ice. Ice can also be used when recovering from surgery. Ice is effective, has very few side effects, and is safe for most people to use. PRECAUTIONS  Ice is not a safe treatment option for people with:  Raynaud phenomenon. This is a condition affecting small blood vessels in the extremities. Exposure to cold may cause your problems to return.  Cold hypersensitivity. There are many forms of cold hypersensitivity, including:  Cold urticaria. Red, itchy hives appear on the skin when the tissues begin to warm after being iced.  Cold erythema. This is a red, itchy rash caused by exposure to cold.  Cold hemoglobinuria. Red blood cells break down when the tissues begin to warm after being iced. The hemoglobin that carry oxygen are passed into the urine because they cannot combine with blood proteins fast enough.  Numbness or altered sensitivity in the area being iced. If you have any of the following conditions, do not use ice until you have discussed cryotherapy with your caregiver:  Heart conditions, such as arrhythmia, angina, or chronic heart disease.  High blood pressure.  Healing wounds or open skin in the area being iced.  Current infections.  Rheumatoid arthritis.  Poor circulation.  Diabetes. Ice slows the blood flow in the region it is applied. This is beneficial when trying to stop inflamed tissues from spreading irritating chemicals to surrounding tissues. However, if you expose your skin to cold temperatures  for too long or without the proper protection, you can damage your skin or nerves. Watch for signs of skin damage due to cold. HOME CARE INSTRUCTIONS Follow these tips to use ice and cold packs safely.  Place a dry or damp towel between the ice and skin. A damp towel will cool the skin more quickly, so you may need to shorten the time that the ice is used.  For a more rapid response, add gentle compression to the ice.  Ice for no more than 10 to 20 minutes at a time. The bonier the area you are icing, the less time it will take to get the benefits of ice.  Check your skin after 5 minutes to make sure there are no signs of a poor response to cold or skin damage.  Rest 20 minutes or more between uses.  Once your skin is numb, you can end your treatment. You can test numbness by very lightly touching your skin. The touch should be so light that you do not see the skin dimple from the pressure of your fingertip. When using ice, most people will feel these normal sensations in this order: cold, burning, aching, and numbness.  Do not use ice on someone who cannot communicate their responses to pain, such as small children or people with dementia. HOW TO MAKE AN ICE PACK Ice packs are the most common way to use ice therapy. Other methods include ice massage, ice baths, and cryosprays. Muscle creams that cause a cold, tingly feeling do not offer the same benefits that ice offers and should not be used as a substitute unless recommended by your caregiver. To make an ice pack, do one of the following:  Place crushed ice or a bag of frozen vegetables in a sealable plastic bag. Squeeze out the excess air. Place this bag inside another plastic bag. Slide the bag into a pillowcase or place a damp towel between your skin and the bag.  Mix 3 parts water with 1 part rubbing alcohol. Freeze the mixture in a sealable plastic bag. When you remove the mixture from the freezer, it will be slushy. Squeeze out the  excess air. Place this bag inside another plastic bag. Slide the bag into a pillowcase or place a damp towel between your skin and the bag. SEEK MEDICAL CARE IF:  You develop white spots on your skin. This may give the skin a blotchy (mottled) appearance.  Your skin turns blue or pale.  Your skin becomes waxy or hard.  Your swelling gets worse. MAKE SURE YOU:   Understand these instructions.  Will watch your condition.  Will get help right away if you are not doing well or get worse. Document Released: 05/03/2011 Document Revised: 01/21/2014 Document Reviewed: 05/03/2011 [facility] Patient Information 2015 [place], [place]. This information is not intended to replace advice given to you by your health care provider. Make sure you discuss any questions you have with your health care provider.      Emergency Department Resource Guide 1) Find a Doctor and Pay Out of Pocket Although you won't have to find out who is covered by your insurance plan, it  is a good idea to ask around and get recommendations. You will then need to call the office and see if the doctor you have chosen will accept you as a new patient and what types of options they offer for patients who are self-pay. Some doctors offer discounts or will set up payment plans for their patients who do not have insurance, but you will need to ask so you aren't surprised when you get to your appointment.  2) Contact Your Local Health Department Not all health departments have doctors that can see patients for sick visits, but many do, so it is worth a call to see if yours does. If you don't know where your local health department is, you can check in your phone book. The CDC also has a tool to help you locate your state's health department, and many state websites also have listings of all of their local health departments.  3) Find a Walk-in Clinic If your illness is not likely to be very severe or complicated, you may want to try a  walk in clinic. These are popping up all over the country in pharmacies, drugstores, and shopping centers. They're usually staffed by nurse practitioners or physician assistants that have been trained to treat common illnesses and complaints. They're usually fairly quick and inexpensive. However, if you have serious medical issues or chronic medical problems, these are probably not your best option.  No Primary Care Doctor: - Call Health Connect at  [phone] - they can help you locate a primary care doctor that  accepts your insurance, provides certain services, etc. - Physician Referral Service- [phone]  Chronic Pain Problems: Organization         Address  Phone   Notes  [facility]  [phone] Patients need to be referred by their primary care doctor.   Medication Assistance: Organization         Address  Phone   Notes  [facility] [address] [place] [place], [place] [address] [phone] --Must be a resident of [facility] -- Must have NO insurance coverage whatsoever (no Medicaid/ Medicare, etc.) -- The pt. MUST have a primary care doctor that directs their care regularly and follows them in the community   [facility]  [phone]   [employer]  [phone]    Agencies that provide inexpensive medical care: Organization         Address  Phone   Notes  [facility]  [phone]   [facility]    [phone]   [facility] [address] [place], [place] [address] [phone]   [facility] [address] [place] [phone]   Planned Parenthood    [phone]   [facility]    [phone]   [facility]  [address], [place] Phone:  [phone], Fax:  [phone] Hours of Operation:  9 am - 6 pm, M-F.  Also accepts Medicaid/Medicare and self-pay.  [facility]  [address], [place] Phone: [phone], Fax: [phone]. Hours of Operation:  8:30 am - 5:30 pm, M-F.  Also accepts Medicaid and self-pay.  [facility] [place] [address], [place] Phone: [phone]   [facility] [address] Natasha [place]ence [place], [place] [phone], Ext. [phone] Mondays & Thursdays: 7-9 AM.  First 15 patients are seen on a first come, first serve basis.    Medicaid-accepting [facility] Providers:  Organization         Address  Phone   Notes  [facility] [address], [place] [phone] Also accepts self-pay patients.  [facility] [address] Laurell Josephs [place], [place]  [phone]   [facility] [address], [place] [phone]   [facility] [address], [place] [phone]   Renaye Rakers [address], [place]   [phone] Only accepts [place] Access [place] patients after they have their name applied to their card.   Self-Pay (no insurance) in [facility]:  Organization         Address  Phone   Notes  Sickle Cell Patients, [facility] [address] Liborio Negrin [place], [place] [phone]   [facility] [address] [place], [place] [phone]   [facility]  [address], [place] [phone]   [facility]/Dr. Osei-Bonsu  [address], [place] or [address], [place] [phone] Phone number for both [place] and [place] locations is the same.  [facility] [address] [place] [phone]   [facility] [address] [place] or [address] [phone]   [facility] [address] [place] [phone], phone; [phone], fax Sees  patients 1st and 3rd Saturday of every month.  Must not qualify for public or private insurance (i.e. Medicaid, Medicare, [place] Health Choice, Veterans' Benefits)  Household income should be no more than 200% of the poverty level The clinic cannot treat you if you are pregnant or think you are pregnant  Sexually transmitted diseases are not treated at the clinic.    Dental Care: Organization         Address  Phone  Notes  [facility] [address] [place], [place] [phone] Accepts children up to age 67 who are enrolled in [place] or [place] Health Choice; pregnant women with a Medicaid card; and children who have applied for Medicaid or [place] Health Choice, but were declined, whose parents can pay a reduced fee at time of service.  [facility] of [facility]  [address], [place] [phone] Accepts children up to age 3 who are enrolled in [place] or [place] Health Choice; pregnant women with a Medicaid card; and children who have applied for Medicaid or  Health Choice, but were declined, whose parents can pay a reduced fee at time of service.  [facility]  [address] [place], [place] [phone] Patients are seen by appointment only. Walk-ins are not accepted. [facility] will see patients 42 years of age and older. Monday - Tuesday (8am-5pm) Most Wednesdays (8:30-5pm) $30 per visit, cash only  [facility] Access [facility]  [address], [facility] [phone] Patients are seen by appointment only. Walk-ins are not accepted. [facility] will see patients 65 years of age and older. One Wednesday Evening (Monthly: Volunteer Based).  $30 per visit, cash only  [employer]  [phone] for adults; Children under age 87, call Graduate Pediatric  Dentistry at [phone]. Children aged 69-14, please call [phone] to request a  pediatric application.  Dental services are provided in all areas of dental care including fillings, crowns and bridges, complete and partial dentures, implants, gum treatment, root canals, and extractions. Preventive care is also provided. Treatment is provided to both adults and children. Patients are selected via a lottery and there is often a waiting list.   [facility] [address], [place]  [phone] [email or web address]   [facility] [address] [place], [place] [phone], Ext. [phone] Second and Fourth Thursday of each month, opens at 6:30 AM; Clinic ends at 9 AM.  Patients are seen on a first-come first-served basis, and a limited number are seen during each clinic.   [facility]  [address] Ether Griffins [place], [place] [phone]   Eligibility Requirements You must have lived in [place], [place], or [place] counties for at least the last three months.   You cannot be eligible for state or federal sponsored [place], including [employer], [place], or [employer].   You generally cannot be eligible for healthcare insurance through your employer.    How to apply: Eligibility screenings are held every Tuesday and Wednesday afternoon from 1:00 pm until 4:00 pm. You do not need an appointment for the interview!  [facility] [address] [place], [place] [phone]   [facility] Health Department  [phone]   [facility] Health Department  [phone]   [facility] Health Department  [phone]    Behavioral Health Resources in the Community: Intensive Outpatient Programs Organization         Address  Phone  Notes  [facility] [address], [place], [place] [phone]   [facility] Outpatient [address], [place], [place] [phone]   [facility] [address] [place], [place]  [phone]   [facility]  Mental Health [address],  [place], [place] [phone] or [phone]) [phone]   Substance Abuse Resources Organization         Address  Phone  Notes  [facility]  ([phone]   [facility]  ([phone])[phone]   [facility]  [phone]   Floydene Flock  [phone]   Residential & Outpatient Substance Abuse Program  [phone]   Psychological Services Organization         Address  Phone  Notes  [facility] Behavioral Health  [phone]   [facility] Services  [phone]   [facility] Mental Health [address], [place] [phone] or [phone]    Mobile Crisis Teams Organization         Address  Phone  Notes  [facility], Mobile Crisis Care Unit  [phone]   [facility]  [address] [place], [place] [phone]   Doristine Locks [address] [place] [phone]    Self-Help/Support Groups Organization         Address  Phone             Notes  [facility] [place] - variety of support groups  336- I7437963 Call for more information  Narcotics Anonymous (NA), [facility] [address], [employer] [place]  2 meetings at this location   Statistician         Address  Phone  Notes  [facility] [address],  [place]  [phone]   [facility]  [address] [place] [address], [place], [place] [phone]   [facility] [address] [place], [place] [phone] Admissions: 8am-3pm M-F  [facility] [address]    [place], [place] [phone]   [facility] [address] [place], [place], [place] [phone]   [facility] [address],  [place], [place] [phone]   [facility] - Intensive Outpatient [address] Laurell Josephs [address], [place], [place] [phone]   [facility] ([facility] [address] [place]    [place], [place] [phone] or [phone]   [facility] ([facility]) [address], [place], [place] [phone] Accepts Medicaid  [facility] [address],  [place] [phone] Substance Abuse/Addiction Treatment   [facility] Organization         Address  Phone  Notes  [facility]  [phone]   Angie Fava, PhD [address] Ervin Knack [place], [place]   [phone] or [phone]   [facility] Behavioral   [address] [place], [place] [phone]   [facility] [address] [place], [place] [phone] Insurance/Medicaid/sponsorship through [facility] [address]                                    [place], [place] [phone] Therapy/tele-psych/case  [facility] [address], [place] [phone]    Dr. Lolly Mustache  [phone]   [facility]  [employer] [facility] Dept. 1) [address] [place] 2) [address] [place] 3)  [address], [place] [phone]  [phone]   [facility] Child Abuse Hotline (671) 105-2639 or (336)843-4995 (After Hours)

## 2015-06-24 NOTE — ED Notes (Signed)
Gave pt gingerale, crackers and peanut butter, and [place] sandwich

## 2015-06-24 NOTE — ED Notes (Signed)
CBG 124 

## 2015-06-24 NOTE — ED Notes (Signed)
Dr. Rhunette Croft bedside for suturing.

## 2015-06-24 NOTE — Progress Notes (Signed)
Orthopedic Tech Progress Note Patient Details:  Shari Brandt [birth year] [id number]  Patient ID: Shari Brandt, female   DOB: 10/31/1950, 64 y.o.   MRN: [id number]   Shari Brandt 06/24/2015, 11:33 AMTrauma activation

## 2015-06-24 NOTE — ED Notes (Signed)
Patient transported to CT 

## 2015-06-24 NOTE — ED Provider Notes (Addendum)
Signed out by Dr Rhunette Croft at 0830 that Ct chest/abd/pelvis is pending, and that patient may be d/c'd to home if/when CTs neg acute.  CTs neg for acute process.   Recheck pt, awake and alert. No tremor or shakes.  abd soft non tender.  CTLS spine non tender. Chest cta bil.   Po fluids, happy meal.  Ambulated in hall.  Pt denies needing or wanting etoh rehab or detox.  Denies any thoughts of harm to self.  No new c/o on recheck. Pt comfortable appearing.    Cathren Laine, MD 06/24/15 1014  Pt ambulatory about ED. Tolerating po. No new c/o. No pain.    Family to take pt home.    bp 136/64, rr 16, hr 96, pulse ox 98%.   Pt currently appears stable for d/c.    Cathren Laine, MD 06/24/15 1229

## 2015-06-24 NOTE — ED Notes (Signed)
Ambulated pt with assist x2. Pt dizzy, but able to ambulate. Pt tolerated food/gingerale

## 2015-06-24 NOTE — ED Notes (Signed)
Patient is resting comfortably. 

## 2015-06-24 NOTE — Progress Notes (Signed)
°   06/23/15 2257  Clinical Encounter Type  Visited With Patient;Family;Health care provider  Visit Type Initial;ED;Trauma  Stress Factors  Patient Stress Factors Health changes;Lack of knowledge  Family Stress Factors Family relationships;Health changes;Lack of knowledge   Responded to page to Trauma B. Patient was being evaluated by medical staff. Patient was alert and gave me her daughters phone number to call [phone]. I notified patient's daughter and she said she was on her way. Provided emotional support to patient and daughter. Patient was alert and calm upon my departure.   Lorna Few, Chaplain 06/23/2015, 23:17 pm

## 2015-06-25 LAB — ABO/RH: ABO/RH(D): B POS

## 2015-08-16 ENCOUNTER — Encounter (HOSPITAL_COMMUNITY): Payer: Self-pay | Admitting: Emergency Medicine

## 2015-09-06 ENCOUNTER — Emergency Department (HOSPITAL_COMMUNITY)
Admission: EM | Admit: 2015-09-06 | Discharge: 2015-09-07 | Disposition: A | Discharge status: Home / Self Care | Payer: Medicare Other | Attending: Emergency Medicine | Admitting: Emergency Medicine

## 2015-09-06 ENCOUNTER — Emergency Department (HOSPITAL_COMMUNITY): Payer: Medicare Other

## 2015-09-06 ENCOUNTER — Encounter (HOSPITAL_COMMUNITY): Payer: Self-pay | Admitting: Emergency Medicine

## 2015-09-06 DIAGNOSIS — M19011 Primary osteoarthritis, right shoulder: Secondary | ICD-10-CM | POA: Insufficient documentation

## 2015-09-06 DIAGNOSIS — J45909 Unspecified asthma, uncomplicated: Secondary | ICD-10-CM | POA: Insufficient documentation

## 2015-09-06 DIAGNOSIS — Z7952 Long term (current) use of systemic steroids: Secondary | ICD-10-CM | POA: Diagnosis not present

## 2015-09-06 DIAGNOSIS — T148XXA Other injury of unspecified body region, initial encounter: Secondary | ICD-10-CM

## 2015-09-06 DIAGNOSIS — S199XXA Unspecified injury of neck, initial encounter: Secondary | ICD-10-CM | POA: Diagnosis not present

## 2015-09-06 DIAGNOSIS — W1789XA Other fall from one level to another, initial encounter: Secondary | ICD-10-CM | POA: Diagnosis not present

## 2015-09-06 DIAGNOSIS — Z794 Long term (current) use of insulin: Secondary | ICD-10-CM | POA: Insufficient documentation

## 2015-09-06 DIAGNOSIS — M19012 Primary osteoarthritis, left shoulder: Secondary | ICD-10-CM | POA: Diagnosis not present

## 2015-09-06 DIAGNOSIS — K219 Gastro-esophageal reflux disease without esophagitis: Secondary | ICD-10-CM | POA: Insufficient documentation

## 2015-09-06 DIAGNOSIS — D649 Anemia, unspecified: Secondary | ICD-10-CM | POA: Insufficient documentation

## 2015-09-06 DIAGNOSIS — G4733 Obstructive sleep apnea (adult) (pediatric): Secondary | ICD-10-CM | POA: Diagnosis not present

## 2015-09-06 DIAGNOSIS — M19041 Primary osteoarthritis, right hand: Secondary | ICD-10-CM | POA: Diagnosis not present

## 2015-09-06 DIAGNOSIS — I10 Essential (primary) hypertension: Secondary | ICD-10-CM | POA: Insufficient documentation

## 2015-09-06 DIAGNOSIS — G8929 Other chronic pain: Secondary | ICD-10-CM | POA: Insufficient documentation

## 2015-09-06 DIAGNOSIS — F17200 Nicotine dependence, unspecified, uncomplicated: Secondary | ICD-10-CM | POA: Diagnosis not present

## 2015-09-06 DIAGNOSIS — Y999 Unspecified external cause status: Secondary | ICD-10-CM | POA: Diagnosis not present

## 2015-09-06 DIAGNOSIS — E119 Type 2 diabetes mellitus without complications: Secondary | ICD-10-CM | POA: Insufficient documentation

## 2015-09-06 DIAGNOSIS — M170 Bilateral primary osteoarthritis of knee: Secondary | ICD-10-CM | POA: Diagnosis not present

## 2015-09-06 DIAGNOSIS — Z9981 Dependence on supplemental oxygen: Secondary | ICD-10-CM | POA: Diagnosis not present

## 2015-09-06 DIAGNOSIS — Y9389 Activity, other specified: Secondary | ICD-10-CM | POA: Insufficient documentation

## 2015-09-06 DIAGNOSIS — Z79899 Other long term (current) drug therapy: Secondary | ICD-10-CM | POA: Insufficient documentation

## 2015-09-06 DIAGNOSIS — Y9289 Other specified places as the place of occurrence of the external cause: Secondary | ICD-10-CM | POA: Diagnosis not present

## 2015-09-06 DIAGNOSIS — S0990XA Unspecified injury of head, initial encounter: Secondary | ICD-10-CM | POA: Diagnosis present

## 2015-09-06 DIAGNOSIS — IMO0002 Reserved for concepts with insufficient information to code with codable children: Secondary | ICD-10-CM

## 2015-09-06 DIAGNOSIS — M19042 Primary osteoarthritis, left hand: Secondary | ICD-10-CM | POA: Insufficient documentation

## 2015-09-06 DIAGNOSIS — G43909 Migraine, unspecified, not intractable, without status migrainosus: Secondary | ICD-10-CM | POA: Insufficient documentation

## 2015-09-06 DIAGNOSIS — S01111A Laceration without foreign body of right eyelid and periocular area, initial encounter: Secondary | ICD-10-CM | POA: Diagnosis not present
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
## 2015-09-06 NOTE — ED Notes (Signed)
Pt arrives via EMS for fall, reports her knees "gave out." [place] ETOH on board, pt reports 3 shots of vodka. Pt reports head and neck pain. Abrasion and laceration above R eye. VSS.

## 2015-09-07 DIAGNOSIS — S01111A Laceration without foreign body of right eyelid and periocular area, initial encounter: Secondary | ICD-10-CM | POA: Diagnosis not present

## 2015-09-07 NOTE — ED Notes (Signed)
Assisted patient with bedpan. Held urine specimen. Pt in NAD

## 2015-09-07 NOTE — Discharge Instructions (Signed)
Facial Laceration ° A facial laceration is a cut on the face. These injuries can be painful and cause bleeding. Lacerations usually heal quickly, but they need special care to reduce scarring. °DIAGNOSIS  °Your health care provider will take a medical history, ask for details about how the injury occurred, and examine the wound to determine how deep the cut is. °TREATMENT  °Some facial lacerations may not require closure. Others may not be able to be closed because of an increased risk of infection. The risk of infection and the chance for successful closure will depend on various factors, including the amount of time since the injury occurred. °The wound may be cleaned to help prevent infection. If closure is appropriate, pain medicines may be given if needed. Your health care provider will use stitches (sutures), wound glue (adhesive), or skin adhesive strips to repair the laceration. These tools bring the skin edges together to allow for faster healing and a better cosmetic outcome. If needed, you may also be given a tetanus shot. °HOME CARE INSTRUCTIONS °· Only take over-the-counter or prescription medicines as directed by your health care provider. °· Follow your health care provider's instructions for wound care. These instructions will vary depending on the technique used for closing the wound. °For Sutures: °· Keep the wound clean and dry.   °· If you were given a bandage (dressing), you should change it at least once a day. Also change the dressing if it becomes wet or dirty, or as directed by your health care provider.   °· Wash the wound with soap and water 2 times a day. Rinse the wound off with water to remove all soap. Pat the wound dry with a clean towel.   °· After cleaning, apply a thin layer of the antibiotic ointment recommended by your health care provider. This will help prevent infection and keep the dressing from sticking.   °· You may shower as usual after the first 24 hours. Do not soak the  wound in water until the sutures are removed.   °· Get your sutures removed as directed by your health care provider. With facial lacerations, sutures should usually be taken out after 4-5 days to avoid stitch marks.   °· Wait a few days after your sutures are removed before applying any makeup. °For Skin Adhesive Strips: °· Keep the wound clean and dry.   °· Do not get the skin adhesive strips wet. You may bathe carefully, using caution to keep the wound dry.   °· If the wound gets wet, pat it dry with a clean towel.   °· Skin adhesive strips will fall off on their own. You may trim the strips as the wound heals. Do not remove skin adhesive strips that are still stuck to the wound. They will fall off in time.   °For Wound Adhesive: °· You may briefly wet your wound in the shower or bath. Do not soak or scrub the wound. Do not swim. Avoid periods of heavy sweating until the skin adhesive has fallen off on its own. After showering or bathing, gently pat the wound dry with a clean towel.   °· Do not apply liquid medicine, cream medicine, ointment medicine, or makeup to your wound while the skin adhesive is in place. This may loosen the film before your wound is healed.   °· If a dressing is placed over the wound, be careful not to apply tape directly over the skin adhesive. This may cause the adhesive to be pulled off before the wound is healed.   °· Avoid   prolonged exposure to sunlight or tanning lamps while the skin adhesive is in place. °· The skin adhesive will usually remain in place for 5-10 days, then naturally fall off the skin. Do not pick at the adhesive film.   °After Healing: °Once the wound has healed, cover the wound with sunscreen during the day for 1 full year. This can help minimize scarring. Exposure to ultraviolet light in the first year will darken the scar. It can take 1-2 years for the scar to lose its redness and to heal completely.  °SEEK MEDICAL CARE IF: °· You have a fever. °SEEK IMMEDIATE  MEDICAL CARE IF: °· You have redness, pain, or swelling around the wound.   °· You see a yellowish-white fluid (pus) coming from the wound.   °  °This information is not intended to replace advice given to you by your health care provider. Make sure you discuss any questions you have with your health care provider. °  °Document Released: 10/14/2004 Document Revised: 09/27/2014 Document Reviewed: 04/19/2013 °[employer]vier Interactive Patient Education ©2016 [employer] ° °

## 2015-09-07 NOTE — ED Notes (Signed)
Patient left at this time with all belongings. Escorted to lobby to await ride home.

## 2016-01-09 ENCOUNTER — Encounter (HOSPITAL_COMMUNITY): Payer: Self-pay | Admitting: *Deleted

## 2016-01-09 ENCOUNTER — Emergency Department (HOSPITAL_COMMUNITY)
Admission: EM | Admit: 2016-01-09 | Discharge: 2016-01-09 | Disposition: A | Discharge status: Home / Self Care | Payer: Medicare Other | Attending: Emergency Medicine | Admitting: Emergency Medicine

## 2016-01-09 ENCOUNTER — Emergency Department (HOSPITAL_COMMUNITY): Payer: Medicare Other

## 2016-01-09 DIAGNOSIS — Z7984 Long term (current) use of oral hypoglycemic drugs: Secondary | ICD-10-CM | POA: Insufficient documentation

## 2016-01-09 DIAGNOSIS — S161XXA Strain of muscle, fascia and tendon at neck level, initial encounter: Secondary | ICD-10-CM | POA: Diagnosis not present

## 2016-01-09 DIAGNOSIS — S39012A Strain of muscle, fascia and tendon of lower back, initial encounter: Secondary | ICD-10-CM | POA: Insufficient documentation

## 2016-01-09 DIAGNOSIS — J45909 Unspecified asthma, uncomplicated: Secondary | ICD-10-CM | POA: Insufficient documentation

## 2016-01-09 DIAGNOSIS — Y998 Other external cause status: Secondary | ICD-10-CM | POA: Insufficient documentation

## 2016-01-09 DIAGNOSIS — M797 Fibromyalgia: Secondary | ICD-10-CM | POA: Insufficient documentation

## 2016-01-09 DIAGNOSIS — Y9389 Activity, other specified: Secondary | ICD-10-CM | POA: Insufficient documentation

## 2016-01-09 DIAGNOSIS — G8929 Other chronic pain: Secondary | ICD-10-CM | POA: Diagnosis not present

## 2016-01-09 DIAGNOSIS — Z794 Long term (current) use of insulin: Secondary | ICD-10-CM | POA: Diagnosis not present

## 2016-01-09 DIAGNOSIS — Z7952 Long term (current) use of systemic steroids: Secondary | ICD-10-CM | POA: Diagnosis not present

## 2016-01-09 DIAGNOSIS — E860 Dehydration: Secondary | ICD-10-CM | POA: Diagnosis not present

## 2016-01-09 DIAGNOSIS — S199XXA Unspecified injury of neck, initial encounter: Secondary | ICD-10-CM | POA: Diagnosis present

## 2016-01-09 DIAGNOSIS — G5603 Carpal tunnel syndrome, bilateral upper limbs: Secondary | ICD-10-CM | POA: Insufficient documentation

## 2016-01-09 DIAGNOSIS — Z792 Long term (current) use of antibiotics: Secondary | ICD-10-CM | POA: Diagnosis not present

## 2016-01-09 DIAGNOSIS — I10 Essential (primary) hypertension: Secondary | ICD-10-CM | POA: Diagnosis not present

## 2016-01-09 DIAGNOSIS — D649 Anemia, unspecified: Secondary | ICD-10-CM | POA: Diagnosis not present

## 2016-01-09 DIAGNOSIS — E119 Type 2 diabetes mellitus without complications: Secondary | ICD-10-CM | POA: Insufficient documentation

## 2016-01-09 DIAGNOSIS — K219 Gastro-esophageal reflux disease without esophagitis: Secondary | ICD-10-CM | POA: Diagnosis not present

## 2016-01-09 DIAGNOSIS — S3991XA Unspecified injury of abdomen, initial encounter: Secondary | ICD-10-CM | POA: Insufficient documentation

## 2016-01-09 DIAGNOSIS — Y92410 Unspecified street and highway as the place of occurrence of the external cause: Secondary | ICD-10-CM | POA: Diagnosis not present

## 2016-01-09 DIAGNOSIS — R109 Unspecified abdominal pain: Secondary | ICD-10-CM

## 2016-01-09 DIAGNOSIS — Z79899 Other long term (current) drug therapy: Secondary | ICD-10-CM | POA: Insufficient documentation

## 2016-01-09 DIAGNOSIS — M1990 Unspecified osteoarthritis, unspecified site: Secondary | ICD-10-CM | POA: Insufficient documentation

## 2016-01-09 LAB — CBC
HCT: 28.1 % — ABNORMAL LOW (ref 36.0–46.0)
HEMOGLOBIN: 9.8 g/dL — AB (ref 12.0–15.0)
MCH: 31.4 pg (ref 26.0–34.0)
MCHC: 34.9 g/dL (ref 30.0–36.0)
MCV: 90.1 fL (ref 78.0–100.0)
PLATELETS: 250 10*3/uL (ref 150–400)
RBC: 3.12 MIL/uL — AB (ref 3.87–5.11)
RDW: 13.6 % (ref 11.5–15.5)
WBC: 6.9 10*3/uL (ref 4.0–10.5)

## 2016-01-09 LAB — HEPATIC FUNCTION PANEL
ALT: 25 U/L (ref 14–54)
AST: 39 U/L (ref 15–41)
Albumin: 2.8 g/dL — ABNORMAL LOW (ref 3.5–5.0)
Alkaline Phosphatase: 242 U/L — ABNORMAL HIGH (ref 38–126)
Bilirubin, Direct: 0.4 mg/dL (ref 0.1–0.5)
Indirect Bilirubin: 0.7 mg/dL (ref 0.3–0.9)
TOTAL PROTEIN: 5.6 g/dL — AB (ref 6.5–8.1)
Total Bilirubin: 1.1 mg/dL (ref 0.3–1.2)

## 2016-01-09 LAB — I-STAT CHEM 8, ED
BUN: 7 mg/dL (ref 6–20)
CALCIUM ION: 1.07 mmol/L — AB (ref 1.13–1.30)
Chloride: 96 mmol/L — ABNORMAL LOW (ref 101–111)
Creatinine, Ser: 1.4 mg/dL — ABNORMAL HIGH (ref 0.44–1.00)
Glucose, Bld: 160 mg/dL — ABNORMAL HIGH (ref 65–99)
HEMATOCRIT: 35 % — AB (ref 36.0–46.0)
Hemoglobin: 11.9 g/dL — ABNORMAL LOW (ref 12.0–15.0)
Potassium: 3.6 mmol/L (ref 3.5–5.1)
SODIUM: 127 mmol/L — AB (ref 135–145)
TCO2: 18 mmol/L (ref 0–100)

## 2016-01-09 MED ORDER — IOPAMIDOL (ISOVUE-300) INJECTION 61%
INTRAVENOUS | Status: AC
  Administered 2016-01-09: 100 mL
  Filled 2016-01-09: qty 100

## 2016-01-09 MED ORDER — OXYCODONE-ACETAMINOPHEN 5-325 MG PO TABS
2.0000 | ORAL_TABLET | Freq: Once | ORAL | Status: AC
  Administered 2016-01-09: 2 via ORAL
  Filled 2016-01-09: qty 2

## 2016-01-09 MED ORDER — OXYCODONE-ACETAMINOPHEN 5-325 MG PO TABS: 1.0000 | ORAL_TABLET | Freq: Four times a day (QID) | ORAL | Status: AC | PRN

## 2016-01-09 MED ORDER — SODIUM CHLORIDE 0.9 % IV BOLUS (SEPSIS)
1000.0000 mL | Freq: Once | INTRAVENOUS | Status: AC
  Administered 2016-01-09: 1000 mL via INTRAVENOUS

## 2016-01-09 NOTE — Discharge Instructions (Signed)
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
## 2016-01-09 NOTE — ED Notes (Signed)
Pt.is dress into a gown
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
